# Patient Record
Sex: Male | Born: 1945 | Race: White | Hispanic: No | Marital: Married | Smoking: Never smoker
Health system: Southern US, Community
[De-identification: ages and names within clinical notes are randomized; demographics above are authoritative.]

## PROBLEM LIST (undated history)

## (undated) DIAGNOSIS — I10 Essential (primary) hypertension: Secondary | ICD-10-CM

## (undated) DIAGNOSIS — I2510 Atherosclerotic heart disease of native coronary artery without angina pectoris: Secondary | ICD-10-CM

## (undated) DIAGNOSIS — E785 Hyperlipidemia, unspecified: Secondary | ICD-10-CM

## (undated) DIAGNOSIS — Z951 Presence of aortocoronary bypass graft: Secondary | ICD-10-CM

## (undated) DIAGNOSIS — I214 Non-ST elevation (NSTEMI) myocardial infarction: Secondary | ICD-10-CM

## (undated) HISTORY — DX: Non-ST elevation (NSTEMI) myocardial infarction: I21.4

## (undated) HISTORY — DX: Atherosclerotic heart disease of native coronary artery without angina pectoris: I25.10

## (undated) HISTORY — DX: Essential (primary) hypertension: I10

## (undated) HISTORY — DX: Hyperlipidemia, unspecified: E78.5

## (undated) HISTORY — DX: Presence of aortocoronary bypass graft: Z95.1

---

## 2009-02-02 ENCOUNTER — Inpatient Hospital Stay (HOSPITAL_COMMUNITY): Admission: EM | Admit: 2009-02-02 | Discharge: 2009-02-07 | Payer: Self-pay | Admitting: Cardiology

## 2009-02-02 ENCOUNTER — Ambulatory Visit: Payer: Self-pay | Admitting: Surgery

## 2009-02-02 DIAGNOSIS — I214 Non-ST elevation (NSTEMI) myocardial infarction: Secondary | ICD-10-CM

## 2009-02-02 HISTORY — PX: CORONARY ARTERY BYPASS GRAFT: SHX141

## 2009-02-02 HISTORY — DX: Non-ST elevation (NSTEMI) myocardial infarction: I21.4

## 2009-02-02 HISTORY — PX: CARDIAC CATHETERIZATION: SHX172

## 2009-02-17 ENCOUNTER — Inpatient Hospital Stay (HOSPITAL_COMMUNITY): Admission: EM | Admit: 2009-02-17 | Discharge: 2009-02-20 | Payer: Self-pay | Admitting: Emergency Medicine

## 2009-02-17 HISTORY — PX: CARDIAC CATHETERIZATION: SHX172

## 2009-02-28 ENCOUNTER — Ambulatory Visit: Payer: Self-pay | Admitting: Surgery

## 2009-02-28 ENCOUNTER — Encounter: Admission: RE | Admit: 2009-02-28 | Discharge: 2009-02-28 | Payer: Self-pay | Admitting: Surgery

## 2009-11-02 IMAGING — CR DG CHEST 2V
2 series · 2 of 2 positions shown · non-contrast
Comparison: 02/03/2009

CLINICAL DATA: Status post CABG.

CHEST - 2 VIEW

[w chest pa]
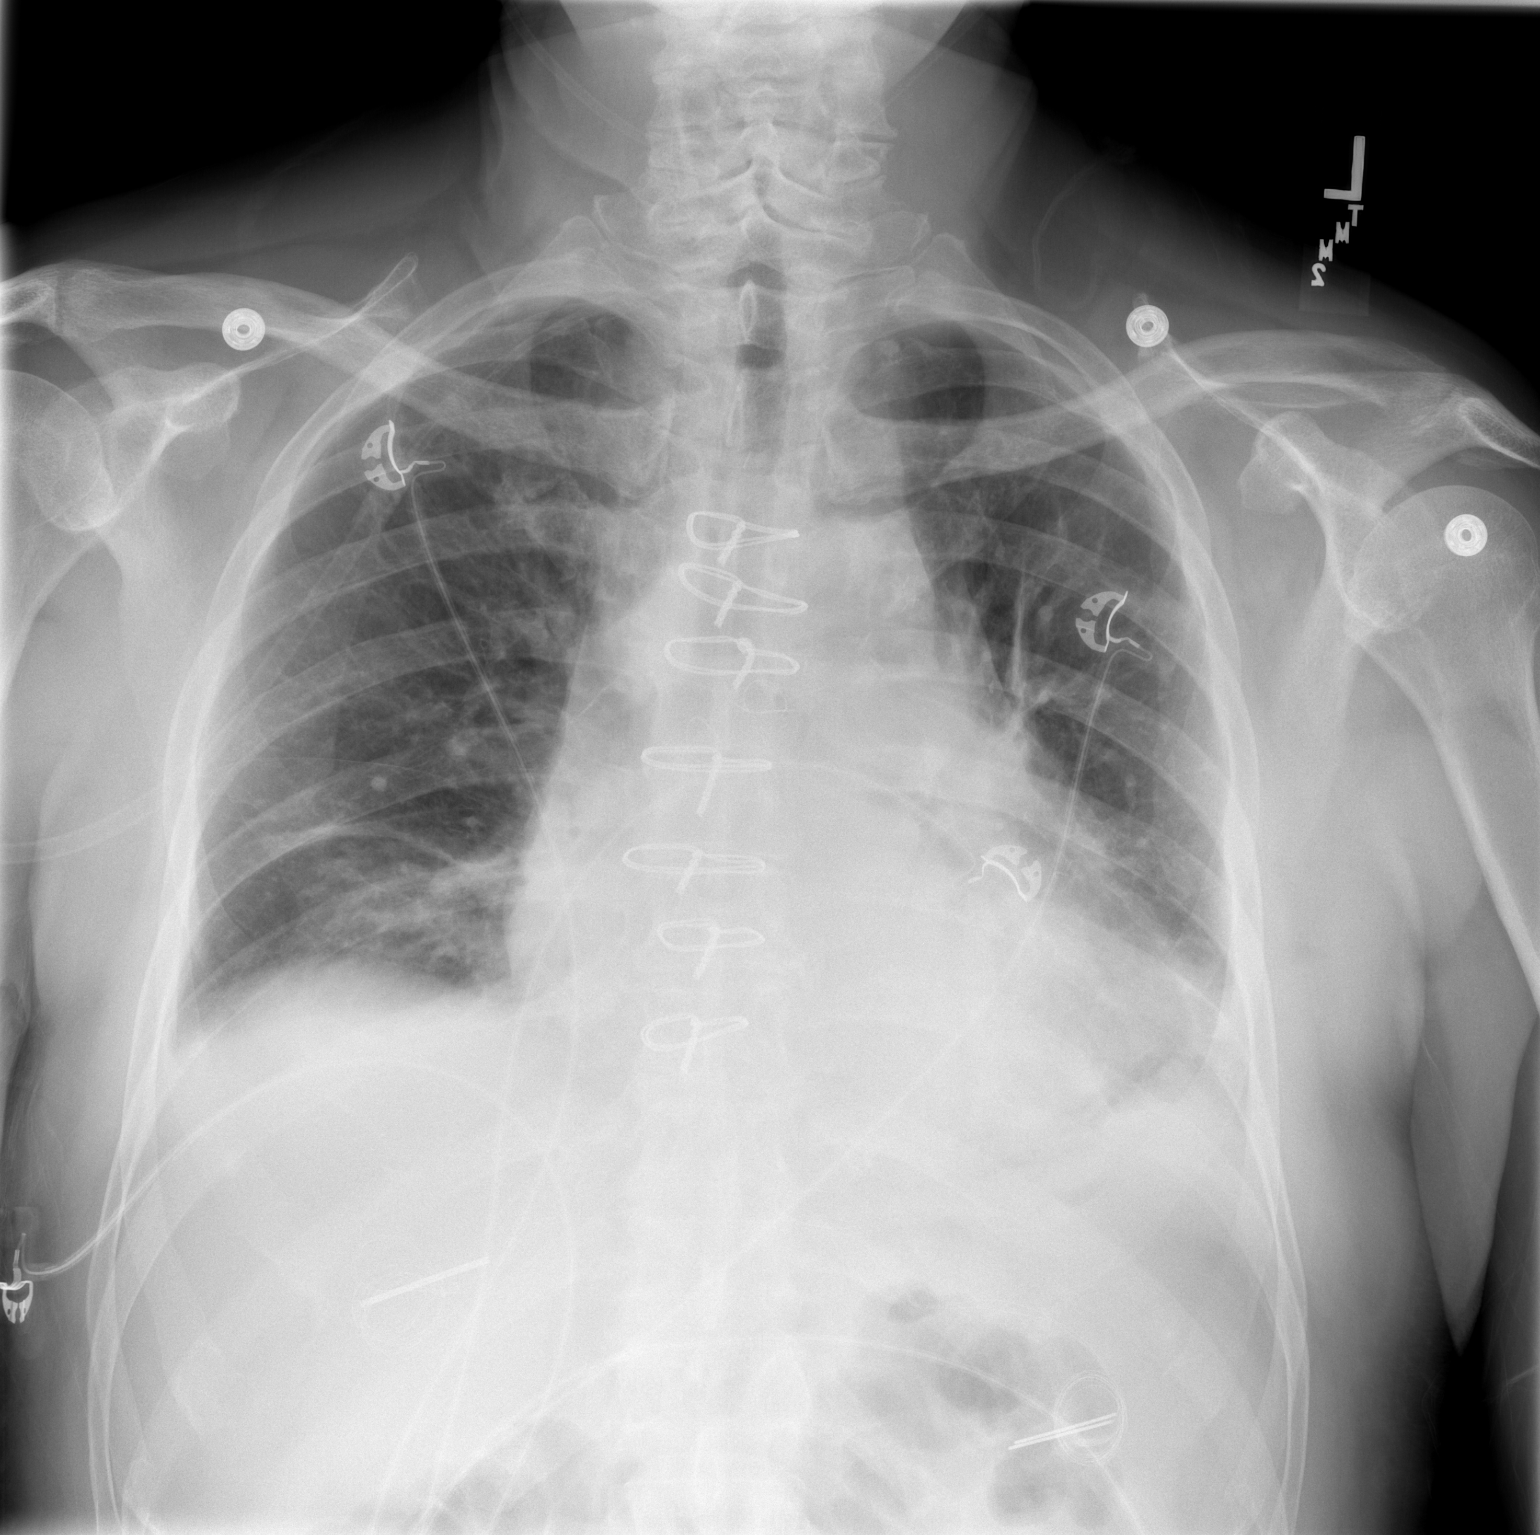

[w chest lat]
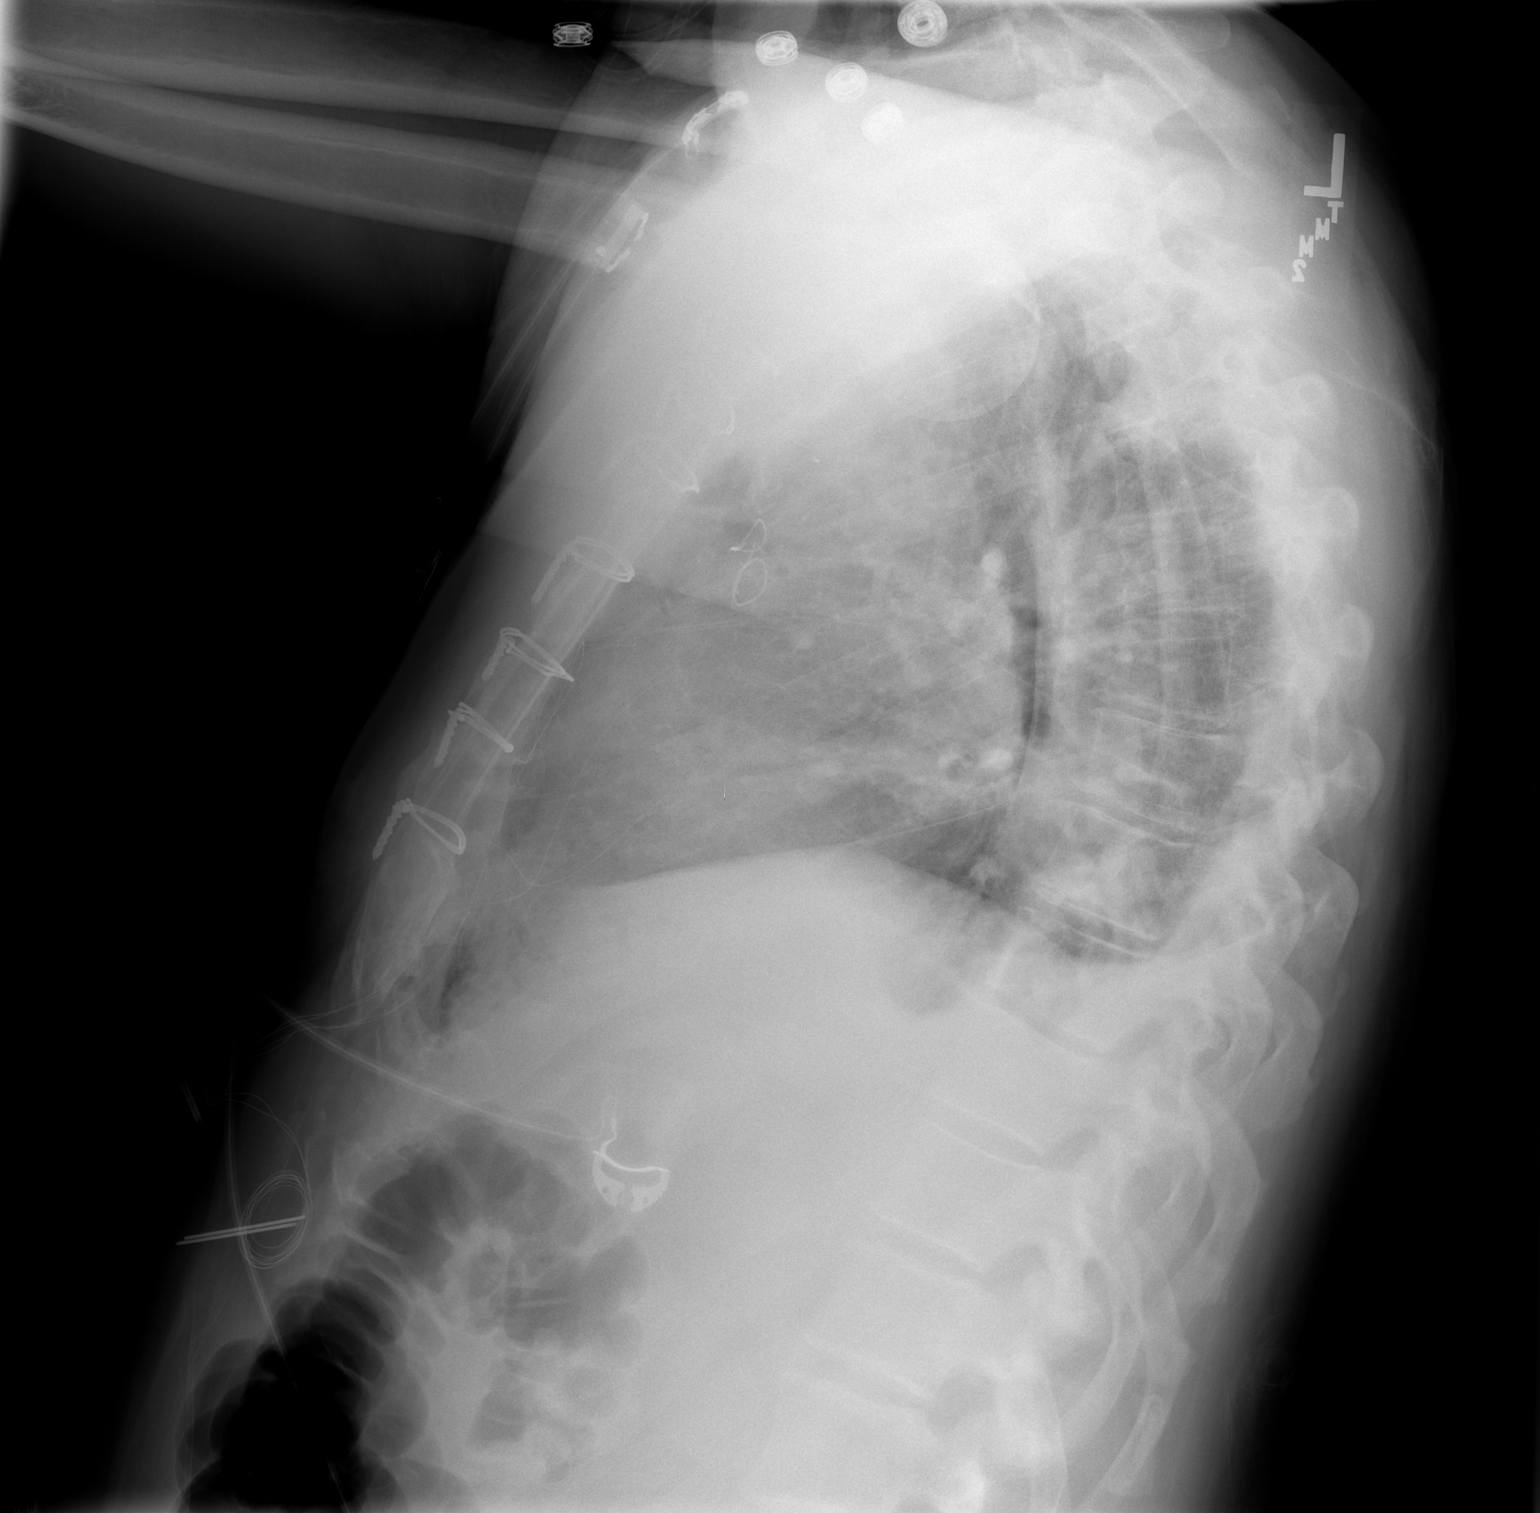

[2 of 2 positions shown; findings below may reference images not displayed]

FINDINGS: The heart size is enlarged.

The chest tubes and mediastinal drain have been removed.  The Swan-
Ganz catheter has also been removed.

There is a tiny right apical pneumothorax which measuresa
approximately 5%. There is atelectasis within both lung bases.

Small bilateral pleural effusions are suspected.
IMPRESSION: 1.  Bilateral pleural effusions and bibasilar atelectasis.
2.  Small right apical pneumothorax.

## 2010-09-21 LAB — BASIC METABOLIC PANEL
BUN: 11 mg/dL (ref 6–23)
CO2: 27 mEq/L (ref 19–32)
Calcium: 8.8 mg/dL (ref 8.4–10.5)
Chloride: 106 mEq/L (ref 96–112)
Chloride: 107 mEq/L (ref 96–112)
Chloride: 107 mEq/L (ref 96–112)
Creatinine, Ser: 1.55 mg/dL — ABNORMAL HIGH (ref 0.4–1.5)
GFR calc Af Amer: 55 mL/min — ABNORMAL LOW (ref >60)
GFR calc Af Amer: 59 mL/min — ABNORMAL LOW (ref >60)
GFR calc non Af Amer: 49 mL/min — ABNORMAL LOW (ref >60)
Glucose, Bld: 106 mg/dL — ABNORMAL HIGH (ref 70–99)
Potassium: 4 mEq/L (ref 3.5–5.1)
Potassium: 4.1 mEq/L (ref 3.5–5.1)
Sodium: 139 mEq/L (ref 135–145)
Sodium: 140 mEq/L (ref 135–145)

## 2010-09-21 LAB — HEMOGLOBIN A1C: Hgb A1c MFr Bld: 5.6 % (ref 4.6–6.1)

## 2010-09-21 LAB — DIFFERENTIAL
Eosinophils Relative: 4 % (ref 0–5)
Lymphocytes Relative: 13 % (ref 12–46)
Monocytes Relative: 6 % (ref 3–12)
Neutrophils Relative %: 77 % (ref 43–77)

## 2010-09-21 LAB — CBC
HCT: 29.2 % — ABNORMAL LOW (ref 39.0–52.0)
Hemoglobin: 9.8 g/dL — ABNORMAL LOW (ref 13.0–17.0)
MCHC: 33.7 g/dL (ref 30.0–36.0)
RBC: 3.31 MIL/uL — ABNORMAL LOW (ref 4.22–5.81)
RBC: 3.63 MIL/uL — ABNORMAL LOW (ref 4.22–5.81)
RBC: 3.72 MIL/uL — ABNORMAL LOW (ref 4.22–5.81)
RDW: 14.2 % (ref 11.5–15.5)
WBC: 5.1 10*3/uL (ref 4.0–10.5)
WBC: 8.1 10*3/uL (ref 4.0–10.5)

## 2010-09-21 LAB — PROTIME-INR
INR: 1.2 (ref 0.00–1.49)
Prothrombin Time: 14.6 seconds (ref 11.6–15.2)

## 2010-09-21 LAB — COMPREHENSIVE METABOLIC PANEL
Alkaline Phosphatase: 77 U/L (ref 39–117)
BUN: 15 mg/dL (ref 6–23)
CO2: 28 mEq/L (ref 19–32)
GFR calc non Af Amer: 45 mL/min — ABNORMAL LOW (ref >60)
Glucose, Bld: 116 mg/dL — ABNORMAL HIGH (ref 70–99)
Potassium: 4 mEq/L (ref 3.5–5.1)
Total Bilirubin: 0.6 mg/dL (ref 0.3–1.2)
Total Protein: 5.3 g/dL — ABNORMAL LOW (ref 6.0–8.3)

## 2010-09-21 LAB — CARDIAC PANEL(CRET KIN+CKTOT+MB+TROPI)
CK, MB: 0.8 ng/mL (ref 0.3–4.0)
CK, MB: 1.6 ng/mL (ref 0.3–4.0)
Relative Index: 11.7 — ABNORMAL HIGH (ref 0.0–2.5)
Relative Index: INVALID (ref 0.0–2.5)
Total CK: 109 U/L (ref 7–232)
Total CK: 39 U/L (ref 7–232)
Troponin I: 0.81 ng/mL (ref 0.00–0.06)
Troponin I: 2.53 ng/mL (ref 0.00–0.06)

## 2010-09-21 LAB — POCT CARDIAC MARKERS: Myoglobin, poc: 74.6 ng/mL (ref 12–200)

## 2010-09-21 LAB — TROPONIN I: Troponin I: 0.49 ng/mL — ABNORMAL HIGH (ref 0.00–0.06)

## 2010-09-21 LAB — CK TOTAL AND CKMB (NOT AT ARMC)
CK, MB: 5.1 ng/mL — ABNORMAL HIGH (ref 0.3–4.0)
Relative Index: INVALID (ref 0.0–2.5)

## 2010-09-21 LAB — TSH: TSH: 5.209 u[IU]/mL — ABNORMAL HIGH (ref 0.350–4.500)

## 2010-09-22 LAB — POCT I-STAT 4, (NA,K, GLUC, HGB,HCT)
Glucose, Bld: 101 mg/dL — ABNORMAL HIGH (ref 70–99)
Glucose, Bld: 107 mg/dL — ABNORMAL HIGH (ref 70–99)
Glucose, Bld: 111 mg/dL — ABNORMAL HIGH (ref 70–99)
HCT: 25 % — ABNORMAL LOW (ref 39.0–52.0)
HCT: 26 % — ABNORMAL LOW (ref 39.0–52.0)
HCT: 30 % — ABNORMAL LOW (ref 39.0–52.0)
Hemoglobin: 11.9 g/dL — ABNORMAL LOW (ref 13.0–17.0)
Hemoglobin: 8.5 g/dL — ABNORMAL LOW (ref 13.0–17.0)
Potassium: 3.5 mEq/L (ref 3.5–5.1)
Potassium: 3.7 mEq/L (ref 3.5–5.1)
Potassium: 4.7 mEq/L (ref 3.5–5.1)
Sodium: 133 mEq/L — ABNORMAL LOW (ref 135–145)
Sodium: 139 mEq/L (ref 135–145)

## 2010-09-22 LAB — POCT I-STAT 3, ART BLOOD GAS (G3+)
Acid-base deficit: 2 mmol/L (ref 0.0–2.0)
Bicarbonate: 23.8 mEq/L (ref 20.0–24.0)
O2 Saturation: 98 %
Patient temperature: 35.3
Patient temperature: 37.3
TCO2: 24 mmol/L (ref 0–100)
TCO2: 27 mmol/L (ref 0–100)
pCO2 arterial: 38.9 mmHg (ref 35.0–45.0)
pCO2 arterial: 43.2 mmHg (ref 35.0–45.0)
pH, Arterial: 7.34 — ABNORMAL LOW (ref 7.350–7.450)
pH, Arterial: 7.433 (ref 7.350–7.450)
pH, Arterial: 7.502 — ABNORMAL HIGH (ref 7.350–7.450)
pH, Arterial: 7.512 — ABNORMAL HIGH (ref 7.350–7.450)
pO2, Arterial: 109 mmHg — ABNORMAL HIGH (ref 80.0–100.0)
pO2, Arterial: 237 mmHg — ABNORMAL HIGH (ref 80.0–100.0)

## 2010-09-22 LAB — BASIC METABOLIC PANEL
CO2: 26 mEq/L (ref 19–32)
CO2: 28 mEq/L (ref 19–32)
Chloride: 102 mEq/L (ref 96–112)
Chloride: 107 mEq/L (ref 96–112)
Chloride: 109 mEq/L (ref 96–112)
Creatinine, Ser: 1.23 mg/dL (ref 0.4–1.5)
Creatinine, Ser: 1.24 mg/dL (ref 0.4–1.5)
GFR calc Af Amer: >60 mL/min (ref >60)
GFR calc Af Amer: >60 mL/min (ref >60)
Glucose, Bld: 143 mg/dL — ABNORMAL HIGH (ref 70–99)
Glucose, Bld: 155 mg/dL — ABNORMAL HIGH (ref 70–99)
Potassium: 4.2 mEq/L (ref 3.5–5.1)
Potassium: 4.7 mEq/L (ref 3.5–5.1)
Sodium: 139 mEq/L (ref 135–145)

## 2010-09-22 LAB — CBC
HCT: 25 % — ABNORMAL LOW (ref 39.0–52.0)
HCT: 27.8 % — ABNORMAL LOW (ref 39.0–52.0)
HCT: 28 % — ABNORMAL LOW (ref 39.0–52.0)
HCT: 37.8 % — ABNORMAL LOW (ref 39.0–52.0)
Hemoglobin: 13 g/dL (ref 13.0–17.0)
Hemoglobin: 8.8 g/dL — ABNORMAL LOW (ref 13.0–17.0)
Hemoglobin: 9.1 g/dL — ABNORMAL LOW (ref 13.0–17.0)
Hemoglobin: 9.5 g/dL — ABNORMAL LOW (ref 13.0–17.0)
Hemoglobin: 9.6 g/dL — ABNORMAL LOW (ref 13.0–17.0)
MCHC: 34.1 g/dL (ref 30.0–36.0)
MCHC: 34.3 g/dL (ref 30.0–36.0)
MCHC: 35 g/dL (ref 30.0–36.0)
MCHC: 35.1 g/dL (ref 30.0–36.0)
MCV: 87.6 fL (ref 78.0–100.0)
MCV: 88.3 fL (ref 78.0–100.0)
MCV: 88.6 fL (ref 78.0–100.0)
MCV: 88.8 fL (ref 78.0–100.0)
RBC: 2.93 MIL/uL — ABNORMAL LOW (ref 4.22–5.81)
RBC: 3.22 MIL/uL — ABNORMAL LOW (ref 4.22–5.81)
RBC: 4.32 MIL/uL (ref 4.22–5.81)
RDW: 13.1 % (ref 11.5–15.5)
RDW: 13.6 % (ref 11.5–15.5)
RDW: 13.6 % (ref 11.5–15.5)
RDW: 13.9 % (ref 11.5–15.5)
WBC: 6.3 10*3/uL (ref 4.0–10.5)
WBC: 7.4 10*3/uL (ref 4.0–10.5)

## 2010-09-22 LAB — LIPID PANEL
Triglycerides: 28 mg/dL (ref <150)
VLDL: 6 mg/dL (ref 0–40)

## 2010-09-22 LAB — COMPREHENSIVE METABOLIC PANEL
AST: 19 U/L (ref 0–37)
BUN: 11 mg/dL (ref 6–23)
CO2: 28 mEq/L (ref 19–32)
Chloride: 102 mEq/L (ref 96–112)
Creatinine, Ser: 1.21 mg/dL (ref 0.4–1.5)
GFR calc non Af Amer: >60 mL/min (ref >60)
Glucose, Bld: 112 mg/dL — ABNORMAL HIGH (ref 70–99)
Total Bilirubin: 1 mg/dL (ref 0.3–1.2)

## 2010-09-22 LAB — TYPE AND SCREEN: Antibody Screen: NEGATIVE

## 2010-09-22 LAB — POCT I-STAT 3, VENOUS BLOOD GAS (G3P V)
Bicarbonate: 24.2 mEq/L — ABNORMAL HIGH (ref 20.0–24.0)
TCO2: 25 mmol/L (ref 0–100)
pCO2, Ven: 39.5 mmHg — ABNORMAL LOW (ref 45.0–50.0)
pH, Ven: 7.395 — ABNORMAL HIGH (ref 7.250–7.300)
pO2, Ven: 47 mmHg — ABNORMAL HIGH (ref 30.0–45.0)

## 2010-09-22 LAB — MRSA PCR SCREENING: MRSA by PCR: NEGATIVE

## 2010-09-22 LAB — POCT I-STAT GLUCOSE
Glucose, Bld: 105 mg/dL — ABNORMAL HIGH (ref 70–99)
Operator id: 3342

## 2010-09-22 LAB — MAGNESIUM: Magnesium: 3 mg/dL — ABNORMAL HIGH (ref 1.5–2.5)

## 2010-09-22 LAB — APTT: aPTT: 36 seconds (ref 24–37)

## 2010-09-22 LAB — GLUCOSE, CAPILLARY: Glucose-Capillary: 100 mg/dL — ABNORMAL HIGH (ref 70–99)

## 2010-10-30 NOTE — Op Note (Signed)
[name], [name]                  ACCOUNT NO.:  [id number]   MEDICAL RECORD NO.:  [id number]          PATIENT TYPE:  INP   LOCATION:  2308                         FACILITY:  [facility]   PHYSICIAN:  Evelene Croon, M.D.     DATE OF BIRTH:  05-26-1946   DATE OF PROCEDURE:  02/03/2009  DATE OF DISCHARGE:                               OPERATIVE REPORT   PREOPERATIVE DIAGNOSES:  Severe three-vessel coronary disease with  unstable angina and non-ST-segment elevation myocardial infarction.   POSTOPERATIVE DIAGNOSIS:  Severe three-vessel coronary disease with  unstable angina and non-ST-segment elevation myocardial infarction.   OPERATIVE PROCEDURES:  Median sternotomy, extracorporeal circulation,  emergency coronary artery bypass graft surgery x3 using a left internal  mammary artery graft to the left anterior descending coronary artery,  with the saphenous vein graft to the diagonal branch of the LAD, and a  saphenous vein graft to the posterior descending branch of the right  coronary artery.  Endoscopic vein harvesting from the right leg.   ATTENDING SURGEON:  Evelene Croon, MD   ASSISTANT:  Rowe Clack, PA-C   ANESTHESIA:  General endotracheal.   CLINICAL HISTORY:  This patient is a previously healthy 65 year old  gentleman with a history of hypertension who recently developed  substernal chest pain on Tuesday of this week.  He said that he was seen  in a [facility] emergency room twice with chest pain, which  resolved and he was discharge from the emergency room.  On the night of  December 02, 2008, he developed stuttering chest pain all night with pain  recurring about every 2 hours.  The pain was relieved with a single  nitroglycerin until early morning when the pain did not resolve with  nitroglycerin and he presented again to the [facility] emergency  room.  Troponin was 0.44 at that time and he was transferred to [facility] for further care.  He was taken to cath lab  urgently by Dr. Julieanne Manson and cardiac catheterization showed severe three vessel disease.  There was a subtotal mid LAD stenosis with faint filling of the distal  LAD around the apex.  There is about 90% diagonal stenosis.  The left  circumflex was a dominant vessel with irregularities, but no stenosis in  the proximal and midportions.  Distally beyond the largest marginal  branch, there is about 60% stenosis and then a 90% stenosis, which  compromised a small posterolateral branch, as well as a large posterior  descending branch.  The right coronary artery was a small nondominant  vessel with moderate stenosis.  Left ventricular function appeared  fairly well preserved, although there was severe hypokinesis of the  distal anterior wall and apex, as well as the inferior wall.  After  being called to the cath lab to see the patient, I felt it would be best  to proceed with emergent coronary bypass surgery given his symptoms and  anatomy.  I discussed the operative procedure with the patient and his  family including alternatives, benefits, and risks including, but not  limited to  bleeding, blood transfusion, infection, stroke, myocardial  infarction, graft failure, and death.  They understood and agreed to  proceed.   OPERATIVE PROCEDURE:  The patient was taken to the operating room and  placed on table in supine position.  After induction of general  endotracheal anesthesia, a Foley catheter was placed in the bladder  using sterile technique.  Preoperative intravenous antibiotics were  given.  Transesophageal echocardiogram was performed by Anesthesiology.  Dr. Kipp Brood performed the TEE.  This showed severe hypokinesis to  akinesis of the distal anterior wall and apex, as well as the inferior  wall.  There was no significant mitral regurgitation.  There was no  aortic stenosis or insufficiency.   Then, the chest, abdomen, and both lower extremities were prepped and  draped in  usual sterile manner.  The chest was entered through a median  sternotomy incision.  Examination of the heart showed hypokinesis of the  anterior wall.  There was good right ventricular function.  The  ascending aorta was of normal size and had no palpable plaques in it.   Then, the left internal mammary artery was harvested from the chest wall  as a pedicle graft.  This is a medium caliber vessel with excellent  blood flow through it.  At the same time, a segment of greater saphenous  vein was harvested from the right leg using endoscopic vein harvest  technique.  This vein was of medium size and good quality.   Then, the patient was heparinized when an adequate activated clotting  time was achieved.  The distal ascending aorta was cannulated using a 20-  French aortic cannula for arterial inflow.  Venous outflow was achieved  using a two-stage venous cannula through the right atrial appendage.  An  antegrade cardioplegia and vent cannula was inserted in order to repair.   The patient was placed on cardiopulmonary bypass and distal coronaries  identified.  The LAD was heavily diseased in its proximal and  midportions, but the distal portion of the vessel was free of disease.  The diagonal branches were small vessels.  The left circumflex gave off  single large marginal branch and midportion, which had no disease in it.  Beyond this, there were too small posterolateral branches that were not  graftable and then a large posterior descending branch, which was  graftable.  The right coronary artery was a small nondominant vessel.   Then, the aorta was cross-clamped and 1500 cc of cold blood antegrade  cardioplegia was administered in the aortic root with quick arrest of  the heart.  Systemic hypothermia to 20 degrees centigrade and topical  hypothermia with iced saline was used.  The temperature probe was placed  in septum insulating pad in the pericardium.  Myocardial temperature  came  down to about 12 degrees centigrade.  Then, the first distal  anastomosis was performed to the posterior descending branch of the left  circumflex coronary artery.  The internal diameter was about 1.75 mm.  Conduit used was a segment of greater saphenous vein and the anastomosis  performed in end-to-side manner using continuous 7-0 Prolene suture.  Flow was noted through the graft was excellent.   Second distal anastomosis was performed to the diagonal branch.  There  is only one diagonal branch that was large enough to graft.  The  internal diameter of this vessel was about 1.6 mm.  Conduit used was a  segment of greater saphenous vein and the anastomosis performed in end-  to-side manner using continuous 7-0 Prolene suture.  Flow was noted  through the graft and was excellent.  Then, another dose of cardioplegia  was given down the vein grafts and aortic root.   The third distal anastomosis was performed at the distal LAD.  The  internal diameter here was about 1.6 mm.  The conduit used was the left  internal mammary graft and was brought through an opening in the left  pericardium anterior to the phrenic nerve.  We anastomosed the LAD in an  end-to-side manner using continuous 8-0 Prolene suture.  The pedicle was  sutured, the epicardium with 6-0 Prolene sutures.  The patient was then  rewarmed to 37 degrees centigrade.  With the cross-clamp in place, the  two proximal vein graft anastomoses were performed in an end-to-side  manner using continuous 6-0 Prolene suture.  The clamp was then removed  from mammary artery pedicle.  There is rapid warming of the ventricular  septum and return of spontaneous ventricular fibrillation.  The cross-  clamp removed with time of 59 minutes and the patient returned to sinus  rhythm.  The proximal and distal anastomoses appeared hemostatic while  the grafts satisfactory.  Graft markers placed around the proximal  anastomoses.  Two temporary right  ventricular and right atrial pacing  wires were placed above through the skin.   Again, the patient rewarmed to 37 degrees centigrade.  He was weaned  from cardiopulmonary bypass on no inotropic agents.  Total bypass time  was 59 minutes.  Cardiac function appeared excellent with the cardiac  output of 7 L per minute.  TEE was again performed and showed  improvement and contractility of the distal anterior wall apex and  inferior wall.  There was no mitral regurgitation.  Protamine was then  given and the venous and aortic cannula was removed without difficulty.  Hemostasis was achieved.  Three chest tubes were placed in the post  pericardium, one in the left pleural space and one in the anterior  mediastinum.  The pericardium was loosely reapproximated over the heart.  Sternum was closed with double #6 stainless steel wires.  The fascia was  closed with continuous #1 Vicryl suture.  Subcutaneous tissue was closed  with continuous 2-0 Vicryl and the skin with a 3-0 Vicryl subcuticular  closure.  The lower extremity vein harvest site was closed in a similar  manner.  The sponge, needle, instrument counts were correct according to  scrub nurse.  Dry sterile dressing applied over the incisions around the  chest tubes, which were Pleur-Evac suction.  The patient remained  hemodynamically stable and was transported to the SICU in stable  condition.      Evelene Croon, M.D.  Electronically Signed     BB/MEDQ  D:  02/03/2009  T:  02/04/2009  Job:  [id number]

## 2010-10-30 NOTE — Assessment & Plan Note (Signed)
OFFICE VISIT   [name], [name]  DOB:  03-27-1946                                        February 28, 2009  CHART #:  [id number]   The patient returned to my office today for followup status post  emergency coronary artery bypass graft surgery x3 on February 03, 2009.  His initial postoperative course was uncomplicated.  He said that after  discharge he was doing well but then had an episode of acute chest pain  that prompted readmission and repeat catheterization by Dr. Clarene Duke.  This showed occlusion of a saphenous vein graft to a small diagonal  branch.  The vein graft was filled with thrombus.  Attempt at opening  the vein graft was unsuccessful.  He had a patent vein graft to the  posterior descending branch of the left circumflex and a patent left  internal mammary graft to the LAD.  He said he was treated medically and  started on Plavix and was discharged.  He said that since discharge he  has continued to improve and is walking without chest pain or shortness  of breath.  He currently has no complaints.   PHYSICAL EXAMINATION:  Vital Signs:  Today his blood pressure is 125/80,  his pulse is 54 and regular, respiratory rate is 18 and unlabored.  Oxygen saturation on room air is 98%.  General:  He looks well.  Cardiac:  Regular rate and rhythm with normal heart sounds.  Lungs:  Clear.  Chest incision is healing well and sternum is stable.  Extremities:  His leg incision is healing well and there is no  peripheral edema.   A followup chest x-ray shows clear lung fields and no pleural effusions.   His medications are aspirin 325 mg daily, Plavix 75 mg daily, Lopressor  25 mg b.i.d., oxycodone p.r.n. for pain, Zocor 20 mg daily, flaxseed oil  daily, Protonix 40 mg daily, and sublingual nitroglycerin p.r.n.   IMPRESSION:  Overall, the patient is doing fairly well following his  bypass surgery and subsequent diagonal vein graft occlusion.  I suspect  this vein graft probably occluded due to large size of his vein compared  to the small size of his diagonal branch.  He is currently asymptomatic.  I told him he could return to driving a car when he feels comfortable  with that, but should refrain from lifting anything heavier than 10  pounds for total of 3 months from the date of surgery.  He will continue  to follow up with Dr. Clarene Duke and will contact me if he develops any  problem with his incision.   [name], M.D.  Electronically Signed   BB/MEDQ  D:  02/28/2009  T:  03/01/2009  Job:  [id number]   cc:   Thereasa Solo. Little, M.D.

## 2010-10-30 NOTE — Assessment & Plan Note (Signed)
OFFICE VISIT   DRAKEN, FARRIOR  DOB:  08/01/1945                                        February 28, 2009  CHART #:  [id number]   The patient returned to my office today for follow up status post  emergent coronary artery bypass graft surgery x3 on 02/03/2009.  He had  an initial uncomplicated postoperative course and was discharged home on  02/07/2009.  He said that he was readmitted to [facility] in early  September after developing acute left-sided chest pain.  He was taken to  cath lab by Dr. Clarene Duke.   Dictation ended at this point.   Evelene Croon, M.D.  Electronically Signed   BB/MEDQ  D:  02/28/2009  T:  03/01/2009  Job:  [id number]

## 2010-10-30 NOTE — Consult Note (Signed)
[name], [name]                  ACCOUNT NO.:  [id number]   MEDICAL RECORD NO.:  [id number]          PATIENT TYPE:  INP   LOCATION:  2308                         FACILITY:  [facility]   PHYSICIAN:  Evelene Croon, M.D.     DATE OF BIRTH:  07/15/1945   DATE OF CONSULTATION:  02/02/2009  DATE OF DISCHARGE:                                 CONSULTATION   REFERRING PHYSICIAN:  Thereasa Solo. Little, MD   REASON FOR CONSULTATION:  Severe three-vessel coronary artery disease  with unstable angina and non-ST segment elevation MI.   CLINICAL HISTORY:  I was asked by Dr. Clarene Duke to evaluate [name] in the  cardiac cath lab for consideration of urgent coronary bypass surgery.  He is a 65 year old previously healthy gentleman who began having  episodes of substernal chest discomfort on Tuesday of this week.  He  said that he was seen at [facility] Emergency Room twice with  chest pain and was discharged.  He began having episodes of chest pain  last night about every 2 hours, relieved with 1 sublingual  nitroglycerin.  This morning, he had an episode that was not relieved by  sublingual nitroglycerin and presented back to the [facility]  Emergency Room.  His troponin I was 0.44.  He was transferred to [facility] and underwent urgent catheterization by Dr. Clarene Duke, which showed a  subtotally occluded mid LAD with slow filling of the distal vessel  around the apex.  There was a moderate-sized diagonal branch that had  about 90% stenosis.  The left circumflex was a dominant vessel that had  proximal regularities.  There was about 80% mid to distal stenosis  beyond the large marginal branch.  Beyond this, there was about 90%  stenosis compromising his small posterolateral and a large posterior  descending branch.  The right coronary artery was a small nondominant  vessel with about 50% ostial and 50-60% midvessel stenosis.  Left  ventricular function was fairly well preserved with some  hypokinesis of  the distal anterior wall and apex.  The patient remained pain free in  the cath lab.   REVIEW OF SYSTEMS:  GENERAL:  He denies any fever or chills.  He has had  no recent weight changes.  He does report recent fatigue.  EYES:  Negative.  ENT:  Negative.  ENDOCRINE:  He denies diabetes and  hypothyroidism.  CARDIOVASCULAR:  As above.  He has had no exertional  dyspnea.  He denies PND and orthopnea.  He has had no peripheral edema  or palpitations.  RESPIRATORY:  He denies cough and sputum production.  GI:  He has had no nausea or vomiting.  He denies melena and bright red  blood per rectum.  GU:  He denies dysuria and hematuria.  MUSCULOSKELETAL:  He denies arthralgias and myalgias.  ALLERGIES:  None.  PSYCHIATRIC:  Negative.  HEMATOLOGICAL:  Negative.  NEUROLOGICAL:  He  denies any focal weakness or numbness.  He denies dizziness and syncope.  He has never had a TIA or stroke.   PAST MEDICAL HISTORY:  Significant only for an admission in 2005 for the  flu with associated dehydration.   SOCIAL HISTORY:  He is retired and married, lives with his wife.  He has  two children.  He is a nonsmoker and denies alcohol abuse.   FAMILY HISTORY:  Negative for cardiac disease.   PHYSICAL EXAMINATION:  VITAL SIGNS:  Blood pressure is 125/75, pulse is  75 and regular, respiratory rate is 16 and unlabored.  GENERAL:  He is a well-developed white male in no distress.  HEENT:  Normocephalic and atraumatic.  Pupils are equal and reactive to  light and accommodation.  Extraocular muscles are intact.  Throat is  clear.  NECK:  Normal carotid pulses bilaterally.  There are no bruits.  There  is no adenopathy or thyromegaly.  CARDIAC:  Regular rate and rhythm with normal S1 and S2.  There is no  murmur, rub, or gallop.  LUNGS:  Clear.  ABDOMEN:  Active bowel sounds.  His abdomen is soft, flat, and  nontender.  There are no palpable masses or organomegaly.  EXTREMITIES:  No peripheral  edema.  Pedal pulses are palpable  bilaterally.  SKIN:  Warm and dry.  NEUROLOGIC:  Alert and oriented x3.  Motor and sensory exams grossly  normal.   IMPRESSION:  [name] has severe three-vessel coronary artery disease  with subtotally occluded left anterior descending (coronary artery) and  high-grade diagonal and posterior descending stenosis.  He has had  stuttering chest pain all night last night and non-ST segment elevation  myocardial infarction.  I agree with Dr. Clarene Duke that proceeding ahead  with urgent coronary artery bypass graft surgery is a best treatment for  further ischemia and infarction.  I discussed the operative procedure  with the patient and his family including alternatives, benefits, and  risks including, but not limited to bleeding, blood transfusion,  infection, stroke, myocardial infarction, graft failure, and death.  He  understands all of this and agrees to proceed.      Evelene Croon, M.D.  Electronically Signed     BB/MEDQ  D:  02/02/2009  T:  02/03/2009  Job:  [id number]   cc:   Thereasa Solo. Little, M.D.

## 2010-10-30 NOTE — Cardiovascular Report (Signed)
[name], [name]                  ACCOUNT NO.:  [id number]   MEDICAL RECORD NO.:  [id number]          PATIENT TYPE:  INP   LOCATION:  2308                         FACILITY:  [facility]   PHYSICIAN:  Thereasa Solo. Little, M.D. DATE OF BIRTH:  05-May-1946   DATE OF PROCEDURE:  02/02/2009  DATE OF DISCHARGE:                            CARDIAC CATHETERIZATION   INDICATIONS FOR TEST:  This 65 year old male has been healthy with no  significant medical problems other than hypertension.  He has had drop  in his exercise tolerance over the last several weeks and has had 3  presentations to [facility] Emergency Room this week.  All for chest  pain.  He was given nitroglycerin, the chest pain improved promptly.  He  came back to the emergency room this morning after having chest pain  that was not responsive to nitroglycerin.  He was given a total of 2  sublingual in the emergency room, his pain went away, and his cardiac  markers came back elevated at 0.44.  His EKG showed no significant  change.  He has the left anterior fascicular block.  Because of the  elevated troponins, he was transferred here for evaluation and  treatment.   After obtaining informed consent, the patient was prepped and draped in  the usual sterile fashion exposing the right groin.  Following local  anesthetic with 1% Xylocaine, the Seldinger technique was employed, and  a 6-French introducer sheath was placed in the right femoral artery.  Left and right coronary arteriography, visualization of the internal  mammary artery, and ventriculography in the RAO projection was  performed.   RESULTS:  Total contrast 90 mL.   EQUIPMENT:  6-French Judkins configuration catheters and a no-torque  catheter was used to cannulate the right coronary artery.   1. Hemodynamic monitoring:  Central aortic pressure was 111/62.  Left      ventricular pressure was 112/1 with no gradient noted at that time      of pullback.  2.  Ventriculography:  Ventriculography in the RAO projection using 20      mL of contrast at 12 mL per second performed at the end of the      procedure revealed the apical and distal inferior wall to be      severely hypokinetic.  Ejection fraction was 45%.  No mitral      regurgitation was seen.  3. Coronary angiogram.      a.     Left main normal and bifurcated.      b.     LAD.  The LAD was subtotaled in its midportion and there       were multiple sequential areas of 90% narrowing distal to the       subtotaled area.  The first diagonal also had 80-90% narrowing in       its proximal segment.      c.     Circumflex.  The circumflex was a large dominant vessel of       about 3.5 to 4 mm in diameter.  There were 4  large OM vessels and       a PDA.  The proximal and mid segments has had mild irregularities       in the circumflex, but in the distal portion of the mid, there was       80% area of narrowing and 90% area distally at the takeoff of       fourth OM, and there was 60% narrowing at the takeoff of the PDA.   Right coronary artery:  The right coronary was nondominant.  He had  distal 70% narrowing and mid 60% narrowing.  There was some distal  collaterals to the PDA.   The internal mammary artery was 2.5 to 3 mm in diameter and widely  patent.   CONCLUSION:  1. Severe coronary artery disease in the left dominant system.  2. Mild left ventricular dysfunction, 45% ejection fraction with      apical wall motion abnormality.   He needs an urgent bypass surgery.  He has had chest pain intermittently  the entire morning.  He is, however, pain-free at this point.  I started  him on IV nitroglycerin and will start him on IV heparin once his  arterial sheath is out.  [facility] has been called and they are aware of the  patient.  I have ordered pre-CABG Dopplers.  He is on an ACE and an  aspirin.  He is on a beta-blocker and aspirin.  I did not start him on  an ACE inhibitor.  His  creatinine is 1.7.  He just had a contrast load.  I will restart ACE inhibitors in about 24-36 hours.           ______________________________  Thereasa Solo Little, M.D.     ABL/MEDQ  D:  02/02/2009  T:  02/03/2009  Job:  [id number]   cc:   [facility] Emergency Room  Cath Lab

## 2010-10-30 NOTE — Op Note (Signed)
[name], [name]                  ACCOUNT NO.:  [id number]   MEDICAL RECORD NO.:  [id number]          PATIENT TYPE:  INP   LOCATION:  2308                         FACILITY:  [facility]   PHYSICIAN:  Guadalupe Maple, M.D.  DATE OF BIRTH:  04-29-1946   DATE OF PROCEDURE:  02/02/2009  DATE OF DISCHARGE:                               OPERATIVE REPORT   PROCEDURE:  Intraoperative transesophageal echocardiography.   Mr. [name] is a 65 year old white male who presented with history of  unstable angina, was found to have critical coronary stenosis on cardiac  catheterization, and was brought directly to the operating room from the  cardiac catheterization lab.  Intraoperative transesophageal  echocardiography was requested to evaluate the left and right  ventricular function and to determine if any valvular pathology was  present and to serve as a monitor for intraoperative volume status.   The patient was brought to the operating room at [facility].  General anesthesia was induced without difficulty.  Following uneventful  tracheal intubation and orogastric suctioning, the transesophageal  echocardiography probe was then inserted into the esophagus without  difficulty.   IMPRESSION:  Prebypass Findings:  1. Aortic valve.  The aortic valve was trileaflet and opened normally      with no evidence of stenosis.  There was no aortic insufficiency.      There was no calcification of the leaflets.  2. Mitral valve.  The mitral leaflets coapted well without prolapse or      fluttering.  There was trace mitral insufficiency.  3. Left ventricle.  There was moderate left ventricular dysfunction.      There was mild hypokinesis of the basilar to mid inferior wall.      The distal anterior wall and anterior septum and the apex appeared      severely hypokinetic to akinetic.  Ejection fraction was estimated      at 35% to 40%.  There was no thrombus noted in the left ventricular      apex.   There were no thinned segments of the left ventricular      myocardium noted.  Left ventricular wall thickness measured 0.9 to      1.0 cm, end diastole at the mid papillary level concentrically.  4. Right ventricle.  The right ventricular function appeared within      normal limits.  There was good contractility of the right      ventricular free wall and normal downward motion of the lateral      tricuspid annulus.  5. Tricuspid valve.  Tricuspid valve appeared structurally normal with      trace tricuspid insufficiency.  6. Interatrial septum.  The interatrial septum was intact without      evidence of patent foramen ovale or atrial septal defects.  7. Left atrial appendage.  There was no thrombus noted in the left      atrial appendage or left atrial cavity.  8. Ascending aorta.  The ascending aorta appeared normal with a well-      defined sinotubular ridge and no significant  atheromatous disease      noted.   Postbypass Findings:  1. Aortic valve.  The aortic valve was unchanged from the prebypass      study and appeared normal.  2. Mitral valve.  The mitral valve again showed trace mitral      insufficiency and no prolapse or fluttering of the leaflets.  3. Left ventricle.  There was again hypokinesis of the distal anterior      wall and the anterior septum, which appeared somewhat improved from      the prebypass study.  The basilar and mid inferior wall appeared to      be      contracting normally.  Ejection fraction was estimated at 40% to      45%.  4. Right ventricle.  The right ventricular function appeared normal      and unchanged from the prebypass study.  5. Tricuspid valve.  The tricuspid valve again showed tricuspid      insufficiency.           ______________________________  Guadalupe Maple, M.D.     DCJ/MEDQ  D:  02/02/2009  T:  02/03/2009  Job:  [id number]

## 2010-10-30 NOTE — Discharge Summary (Signed)
[name], [name]                  ACCOUNT NO.:  [id number]   MEDICAL RECORD NO.:  [id number]          PATIENT TYPE:  INP   LOCATION:  2018                         FACILITY:  [facility]   PHYSICIAN:  Evelene Croon, M.D.     DATE OF BIRTH:  [birth year]   DATE OF ADMISSION:  02/02/2009  DATE OF DISCHARGE:  02/07/2009                               DISCHARGE SUMMARY   HISTORY:  The patient is a 65 year old white male with a history of  intermittent chest pain since the Monday prior to admission.  The  patient presented to the emergency department on 3 different occasions  with symptoms of chest pain associated with weakness and fatigue.  On  the third presentation, he was found to have elevation of his troponin I  to 0.44.  He was transferred to [facility] by [facility] for further  evaluation and treatment.  He was admitted for prompt cardiac  catheterization.  Additionally, he was treated medically with aspirin,  nitroglycerin as well as started on a heparin drip.   PAST MEDICAL HISTORY:  Insignificant.  He did have a previous  hospitalization in 2005 for flu with symptoms related to this to include  dehydration and syncope.  No history of diabetes.  No history of  hypertension.   ALLERGIES:  No known allergies.   OUTPATIENT MEDICATIONS:  Nitroglycerin p.r.n.   FAMILY HISTORY:  Mother had a history of heart disease at an older age.  Otherwise, no significant coronary artery disease in his family.   SOCIAL HISTORY:  He is married, retired, uses no tobacco and no alcohol.   REVIEW OF SYMPTOMS AND PHYSICAL EXAMINATION:  Please see the admission  history and physical.   HOSPITAL COURSE:  The patient was admitted urgently and transferred and  taken to the cardiac catheterization lab by Dr. Clarene Duke.  Findings were  substantial for severe 3-vessel coronary artery disease including  subtotal LAD.  Additionally, there was multiple stenoses of the  circumflex of 80%, 90%, and 60% stenosis.  Right  coronary artery had an  ostial 70% and mid 60% stenosis.  The left ventriculogram revealed  apical segment with distal inferior wall severe hypokinesis.  Ejection  fraction was measured at 45%.  Due to these findings, prompt cardiac  surgical consultation was obtained with Evelene Croon, MD, who evaluated  the patient and his studies, and agreed with recommendations to proceed  with emergent surgical revascularization.   PROCEDURE:  On February 02, 2009, he was taken to the cardiac operating  room where he underwent the following procedure, emergency coronary  artery bypass grafting x3 the following grafts were placed:  1. Left internal mammary artery to the LAD.  2. A saphenous vein graft to diagonal.  3. A saphenous vein graft to left posterior descending.   The patient tolerated the procedure well and was taken to the Surgical  Intensive Care Unit in stable condition.   POSTOPERATIVE HOSPITAL COURSE:  The patient has progressed quite nicely.  Inotropic support was weaned without significant difficulty.  He was  weaned from the ventilator also without difficulty.  He does have an  acute blood loss anemia.  His values have stabilized.  His most recent  hemoglobin and hematocrit dated February 06, 2009, are 9.9 and 28.6  respectively.  The patient initially did have a preoperative elevation  of his creatinine to 1.7, but postoperatively this has improved, and his  most recent renal function shows a BUN to be 19 and creatinine 1.24 on  February 06, 2009.  All routine lines, monitors, and drainage devices have  been discontinued in the standard fashion.  Incisions are healing well  without evidence of infection.  Oxygen has been weaned and he maintained  good saturations on room air.  The patient did have episode of  postoperative paroxysmal atrial fibrillation.  He was started initially  on amiodarone intravenous drip, but has transitioned to oral amiodarone.  He is currently maintaining  normal sinus rhythm.  The patient does have  a moderate postoperative volume overload, but this has improved over  time with diuresis.  Tentatively, he is felt to be stable for discharge  in the morning of February 07, 2009, pending morning round reevaluation.   Medications on discharge at the time of this dictation include the  following:  1. Amiodarone 400 mg twice daily for the next 7 days then he is      transition to 200 mg twice daily.  2. Aspirin 325 mg daily.  3. Lasix 40 mg daily for 5 additional days.  4. Metoprolol 25 mg twice daily.  5. Oxycodone 5 mg 1-2 every 4-6 hours as needed for pain.  6. Potassium chloride 20 mEq daily for 5 days.  7. Ramipril 1.25 mg daily.  8. Simvastatin 20 mg daily.  9. Flaxseed oil 3 capsules daily by mouth as taken preoperatively.  10.Omeprazole 20 mg 1 capsule by mouth taken daily as preoperatively.   INSTRUCTIONS:  The patient will receive written instructions in regard  to medications, activity, diet, wound care, and followup.   Follow up include appointment to see Dr. Laneta Simmers on September 14, at 11  a.m. with a chest x-ray.  Additionally, he is instructed to follow up  with Dr. Clarene Duke in 2 weeks post discharge.   FINAL DIAGNOSIS:  Acute non-ST-segment elevation myocardial infarction,  now status post surgical revascularization as described.   OTHER DIAGNOSES:  1. Postoperative acute blood loss anemia.  2. Postoperative volume overload.  3. Acute renal insufficiency preoperatively, resolved postoperatively      may have been related to some degree of dehydration.  4. Postoperative paroxysmal atrial fibrillation, currently in normal      sinus rhythm.      Rowe Clack, P.A.-C.      Evelene Croon, M.D.  Electronically Signed    WEG/MEDQ  D:  02/06/2009  T:  02/07/2009  Job:  [id number]   cc:   Thereasa Solo. Little, M.D.

## 2012-05-17 HISTORY — PX: TRANSTHORACIC ECHOCARDIOGRAM: SHX275

## 2012-06-01 ENCOUNTER — Other Ambulatory Visit (HOSPITAL_COMMUNITY): Payer: Self-pay | Admitting: Internal Medicine

## 2012-06-01 DIAGNOSIS — Z951 Presence of aortocoronary bypass graft: Secondary | ICD-10-CM

## 2012-06-04 ENCOUNTER — Ambulatory Visit (HOSPITAL_COMMUNITY)
Admission: RE | Admit: 2012-06-04 | Discharge: 2012-06-04 | Disposition: A | Discharge status: Home / Self Care | Payer: Medicare Other | Source: Ambulatory Visit | Attending: Internal Medicine | Admitting: Internal Medicine

## 2012-06-04 DIAGNOSIS — I10 Essential (primary) hypertension: Secondary | ICD-10-CM | POA: Insufficient documentation

## 2012-06-04 DIAGNOSIS — Z951 Presence of aortocoronary bypass graft: Secondary | ICD-10-CM | POA: Insufficient documentation

## 2012-06-04 DIAGNOSIS — I2510 Atherosclerotic heart disease of native coronary artery without angina pectoris: Secondary | ICD-10-CM | POA: Insufficient documentation

## 2012-06-04 DIAGNOSIS — E785 Hyperlipidemia, unspecified: Secondary | ICD-10-CM | POA: Insufficient documentation

## 2012-06-04 NOTE — Progress Notes (Signed)
[facility]   2D echo completed 06/04/2012.   Cindy Aysia Lowder, RDCS   

## 2012-11-10 ENCOUNTER — Telehealth: Payer: Self-pay | Admitting: Internal Medicine

## 2012-11-10 NOTE — Telephone Encounter (Signed)
Need an authorization from Dr Rennis Golden to stop his Plavix for seven days-so he can get an epidural Shot-Please fax to Dr Spillane-Fax#[phone]!

## 2012-11-10 NOTE — Telephone Encounter (Signed)
Returned call and spoke w/ pt's wife, Jousha Schwandt.  Informed form was faxed on 5.9.14 and will be faxed again.  Verbalized understanding.  Form faxed.

## 2012-11-11 ENCOUNTER — Telehealth: Payer: Self-pay | Admitting: *Deleted

## 2012-11-11 NOTE — Telephone Encounter (Signed)
Faxed clearance for lumbar epidural steroid injection . Per Dr. Rennis Golden  Hold ASA 7 days restart after;hold plavix 5 days then restart after.  Low cardiovascular risk

## 2013-05-17 ENCOUNTER — Encounter: Payer: Self-pay | Admitting: *Deleted

## 2013-05-18 ENCOUNTER — Ambulatory Visit (INDEPENDENT_AMBULATORY_CARE_PROVIDER_SITE_OTHER): Payer: Medicare Other | Admitting: Internal Medicine

## 2013-05-18 ENCOUNTER — Encounter: Payer: Self-pay | Admitting: Internal Medicine

## 2013-05-18 VITALS — BP 110/70 | HR 62 | Ht 70.0 in | Wt 196.4 lb

## 2013-05-18 DIAGNOSIS — I2510 Atherosclerotic heart disease of native coronary artery without angina pectoris: Secondary | ICD-10-CM

## 2013-05-18 DIAGNOSIS — I10 Essential (primary) hypertension: Secondary | ICD-10-CM

## 2013-05-18 DIAGNOSIS — E782 Mixed hyperlipidemia: Secondary | ICD-10-CM | POA: Insufficient documentation

## 2013-05-18 DIAGNOSIS — E785 Hyperlipidemia, unspecified: Secondary | ICD-10-CM

## 2013-05-18 DIAGNOSIS — Z951 Presence of aortocoronary bypass graft: Secondary | ICD-10-CM

## 2013-05-18 NOTE — Progress Notes (Signed)
OFFICE NOTE  Chief Complaint:  No complaints  Primary Care Physician: Todd Miyamoto, MD  HPI:  [name] is a 67 year old gentleman with a history of non-ST-elevation MI in August of 2010. He had multi-vessel coronary disease and bypass by Dr. Laneta Walters. Subsequent to that 1 month later he had chest pain and was found to have a clot in the saphenous vein graft to a diagonal which ultimately infarcted and was not amenable to opening. At the time of his initial presentation EF was 35-40%, subsequently was 45%. Since then he has had no significant heart failure symptoms, no chest pain, weight gain, worsening shortness of breath, palpitations, presyncope, syncopal symptoms. He recently had a problem with a herniated disc in his back which he managed conservatively. Surgery was contraindicated. He denies any chest pain or worsening shortness of breath. We repeated his echocardiogram in December 2013 which showed EF improved to 50-55%. There was moderate left atrial enlargement.    PMHx:  Past Medical History  Diagnosis Date  . NSTEMI (non-ST elevated myocardial infarction) 02/02/2009    CABGx3  . Hyperlipidemia   . Hypertension   . CAD (coronary artery disease)   . S/P CABG (coronary artery bypass graft)     Past Surgical History  Procedure Laterality Date  . Transthoracic echocardiogram  05/2012    EF 50-55%, grade 1 diastolic dysfunction; MV with systolic "bowing"; LA mod dilated  . Cardiac catheterization  02/02/2009    LAD subtotaled in midportion, 90% narrowing distal to subtotaled areas; first diagonal with 80-90% narrowing in prox segment; Cfx with prox & mid segments with ireegularities, distal portion 80% narrowing & 90& area distally at takeoff of 4th OM, 60% narrowing at takeoff of PDA (Dr. Langston Walters)  . Coronary artery bypass graft  02/02/2009    x3; LIMA to LAD, SVG to PDA, SVG to 1st diagonal (Dr. Wayland Walters)   . Cardiac catheterization  02/17/2009    occluded SVG to  small diagonal (Dr. Langston Walters)     FAMHx:  Family History  Problem Relation Age of Onset  . Heart disease Mother     pacemaker    SOCHx:   reports that he has never smoked. He has never used smokeless tobacco. He reports that he drinks about 6.0 ounces of alcohol per week. He reports that he does not use illicit drugs.  ALLERGIES:  No Known Allergies  ROS: A comprehensive review of systems was negative except for: Constitutional: positive for mild weight gain  HOME MEDS: Current Outpatient Prescriptions  Medication Sig Dispense Refill  . aspirin 81 MG tablet Take 81 mg by mouth daily.      . clopidogrel (PLAVIX) 75 MG tablet Take 75 mg by mouth daily with breakfast.      . gabapentin (NEURONTIN) 300 MG capsule Take 1 capsule by mouth 2 (two) times daily.      Marland Kitchen lisinopril (PRINIVIL,ZESTRIL) 20 MG tablet Take 20 mg by mouth daily.      . metoprolol (LOPRESSOR) 50 MG tablet Take 25 mg by mouth daily.      . simvastatin (ZOCOR) 20 MG tablet Take 20 mg by mouth daily.       No current facility-administered medications for this visit.    LABS/IMAGING: No results found for this or any previous visit (from the past 48 hour(s)). No results found.  VITALS: BP 110/70  Pulse 62  Ht 5\' 10"  (1.778 m)  Wt 196 lb 6.4 oz (89.086 kg)  BMI 28.18 kg/m2  EXAM: General appearance: alert and no distress Neck: no carotid bruit and no JVD Lungs: clear to auscultation bilaterally Heart: regular rate and rhythm, S1, S2 normal, no murmur, click, rub or gallop Abdomen: soft, non-tender; bowel sounds normal; no masses,  no organomegaly Extremities: extremities normal, atraumatic, no cyanosis or edema Pulses: 2+ and symmetric Skin: Skin color, texture, turgor normal. No rashes or lesions Neurologic: Grossly normal Psych: Mood, affect normal, pleasant  EKG: Normal sinus rhythm at 62, incomplete right bundle pattern  ASSESSMENT: 1. Coronary artery disease status post stenting in  2010 2. Three-vessel CABG with LIMA to LAD, SVG to PDA, and SVG to diagonal (occluded) 3. Hypertension-well controlled 4. Dyslipidemia  PLAN: 1.   [name] is doing well on his current medications. His blood pressure is well controlled. His heart rate is higher than it had been when we decreased the dose of his beta blocker at his last visit. His EF has improved significantly to 50-55%. He denies any further chest pain. He continues to be active but has had a small amount of weight gain over the holidays and will need to continue to work on this. He is due for a checkup again at the [facility] in [place] in a few weeks and will send results of his cholesterol profile to our office. The only other change it would make today is that he continues on full dose aspirin and Plavix. I think it is reasonable to switch him to low-dose aspirin and continue Plavix, with similar benefits and less bleeding risk. Plan to see him back in one year or sooner as necessary.  Todd Nose, MD, [facility] Attending Cardiologist [facility]  [name] 05/18/2013, 8:21 AM

## 2013-05-18 NOTE — Patient Instructions (Signed)
Your physician wants you to follow-up in: 1 year. You will receive a reminder letter in the mail two months in advance. If you don't receive a letter, please call our office to schedule the follow-up appointment.  Decrease aspirin to 81mg  daily.

## 2013-05-26 ENCOUNTER — Other Ambulatory Visit: Payer: Self-pay | Admitting: *Deleted

## 2013-05-26 MED ORDER — CLOPIDOGREL BISULFATE 75 MG PO TABS: 75.0000 mg | ORAL_TABLET | Freq: Every day | ORAL | Status: DC

## 2013-05-26 NOTE — Telephone Encounter (Signed)
Clopidogrel refilled electronically to [facility]

## 2013-05-31 ENCOUNTER — Other Ambulatory Visit: Payer: Self-pay | Admitting: *Deleted

## 2013-05-31 MED ORDER — LISINOPRIL 20 MG PO TABS: 20.0000 mg | ORAL_TABLET | Freq: Every day | ORAL | Status: DC

## 2013-05-31 NOTE — Telephone Encounter (Signed)
Rx was sent to pharmacy electronically. 

## 2013-07-12 ENCOUNTER — Other Ambulatory Visit: Payer: Self-pay

## 2013-07-12 MED ORDER — SIMVASTATIN 20 MG PO TABS: 20.0000 mg | ORAL_TABLET | Freq: Every day | ORAL | Status: DC

## 2013-07-12 MED ORDER — METOPROLOL TARTRATE 50 MG PO TABS: 25.0000 mg | ORAL_TABLET | Freq: Every day | ORAL | Status: DC

## 2013-07-12 NOTE — Telephone Encounter (Signed)
Rx was sent to pharmacy electronically. 

## 2013-07-21 ENCOUNTER — Other Ambulatory Visit: Payer: Self-pay | Admitting: *Deleted

## 2013-07-21 NOTE — Telephone Encounter (Signed)
Called patient to inform that Dr. Rennis GoldenHilty reviewed labs and were good.   Patient states needs prescriptions sent to [facility], but will call office back with medications, doses, frequency.

## 2013-07-23 ENCOUNTER — Telehealth: Payer: Self-pay | Admitting: *Deleted

## 2013-07-23 NOTE — Telephone Encounter (Signed)
Pt was calling in regards to Rx questions. Metoprolol  [facility]

## 2013-07-23 NOTE — Telephone Encounter (Signed)
Returned call.  Left message to call back Monday before 4pm.

## 2013-08-05 ENCOUNTER — Telehealth: Payer: Self-pay | Admitting: Internal Medicine

## 2013-08-05 MED ORDER — METOPROLOL TARTRATE 25 MG PO TABS: 25.0000 mg | ORAL_TABLET | Freq: Every day | ORAL | Status: DC

## 2013-08-05 MED ORDER — SIMVASTATIN 20 MG PO TABS: 20.0000 mg | ORAL_TABLET | Freq: Every day | ORAL | Status: DC

## 2013-08-05 MED ORDER — CLOPIDOGREL BISULFATE 75 MG PO TABS: 75.0000 mg | ORAL_TABLET | Freq: Every day | ORAL | Status: DC

## 2013-08-05 MED ORDER — LISINOPRIL 20 MG PO TABS: 20.0000 mg | ORAL_TABLET | Freq: Every day | ORAL | Status: DC

## 2013-08-05 NOTE — Telephone Encounter (Signed)
His new pharmacy [facility] said they have sent for his medicine  Over a week and still have not heard back from the request.

## 2013-08-05 NOTE — Telephone Encounter (Signed)
Returned call and pt verified x 2.  Pt informed message received and no information received from [facility].  Informed RN will review med list now and send in new Rxs.  Pt reviewed med list and Rxs sent to [facility].  Pt verbalized understanding and agreed w/ plan.  Pt stated he has 20-days worth of medication on hand.

## 2014-05-26 ENCOUNTER — Ambulatory Visit (INDEPENDENT_AMBULATORY_CARE_PROVIDER_SITE_OTHER): Payer: Medicare HMO | Admitting: Internal Medicine

## 2014-05-26 ENCOUNTER — Encounter: Payer: Self-pay | Admitting: Internal Medicine

## 2014-05-26 VITALS — BP 148/82 | HR 47 | Ht 70.0 in | Wt 189.9 lb

## 2014-05-26 DIAGNOSIS — R001 Bradycardia, unspecified: Secondary | ICD-10-CM

## 2014-05-26 DIAGNOSIS — Z951 Presence of aortocoronary bypass graft: Secondary | ICD-10-CM

## 2014-05-26 DIAGNOSIS — I2510 Atherosclerotic heart disease of native coronary artery without angina pectoris: Secondary | ICD-10-CM

## 2014-05-26 DIAGNOSIS — I2583 Coronary atherosclerosis due to lipid rich plaque: Secondary | ICD-10-CM

## 2014-05-26 DIAGNOSIS — E785 Hyperlipidemia, unspecified: Secondary | ICD-10-CM

## 2014-05-26 DIAGNOSIS — I10 Essential (primary) hypertension: Secondary | ICD-10-CM

## 2014-05-26 MED ORDER — LISINOPRIL 20 MG PO TABS: 20.0000 mg | ORAL_TABLET | Freq: Every day | ORAL | Status: DC

## 2014-05-26 MED ORDER — CLOPIDOGREL BISULFATE 75 MG PO TABS: 75.0000 mg | ORAL_TABLET | Freq: Every day | ORAL | Status: DC

## 2014-05-26 MED ORDER — METOPROLOL TARTRATE 25 MG PO TABS: 25.0000 mg | ORAL_TABLET | Freq: Every day | ORAL | Status: DC

## 2014-05-26 MED ORDER — SIMVASTATIN 20 MG PO TABS: 20.0000 mg | ORAL_TABLET | Freq: Every day | ORAL | Status: DC

## 2014-05-26 NOTE — Progress Notes (Signed)
OFFICE NOTE  Chief Complaint:  No complaints  Primary Care Physician: Abigail MiyamotoPERRY,LAWRENCE EDWARD, MD  HPI:  [name] is a 68 year old gentleman with a history of non-ST-elevation MI in August of 2010. He had multi-vessel coronary disease and bypass by Dr. Laneta SimmersBartle. Subsequent to that 1 month later he had chest pain and was found to have a clot in the saphenous vein graft to a diagonal which ultimately infarcted and was not amenable to opening. At the time of his initial presentation EF was 35-40%, subsequently was 45%. Since then he has had no significant heart failure symptoms, no chest pain, weight gain, worsening shortness of breath, palpitations, presyncope, syncopal symptoms. He recently had a problem with a herniated disc in his back which he managed conservatively. Surgery was contraindicated. He denies any chest pain or worsening shortness of breath. We repeated his echocardiogram in December 2013 which showed EF improved to 50-55%. There was moderate left atrial enlargement.    [name] returns today for follow-up. He is feeling quite well. He's been active and has been doing deer hunting. He gets a good amount of exercise every day without any chest pain or worsening shortness of breath. [place] [facility] [place] which shows a total cholesterol 146, triglycerides 52, HDL 69 and LDL 67. He's had no ischemia evaluation since his bypass in 2010.  PMHx:  Past Medical History  Diagnosis Date  . NSTEMI (non-ST elevated myocardial infarction) 02/02/2009    CABGx3  . Hyperlipidemia   . Hypertension   . CAD (coronary artery disease)   . S/P CABG (coronary artery bypass graft)     Past Surgical History  Procedure Laterality Date  . Transthoracic echocardiogram  05/2012    EF 50-55%, grade 1 diastolic dysfunction; MV with systolic "bowing"; LA mod dilated  . Cardiac catheterization  02/02/2009    LAD subtotaled in midportion, 90% narrowing distal to subtotaled  areas; first diagonal with 80-90% narrowing in prox segment; Cfx with prox & mid segments with ireegularities, distal portion 80% narrowing & 90& area distally at takeoff of 4th OM, 60% narrowing at takeoff of PDA (Dr. Langston ReusingA. Little)  . Coronary artery bypass graft  02/02/2009    x3; LIMA to LAD, SVG to PDA, SVG to 1st diagonal (Dr. Wayland SalinasB. Bartle)   . Cardiac catheterization  02/17/2009    occluded SVG to small diagonal (Dr. Langston ReusingA. Little)     FAMHx:  Family History  Problem Relation Age of Onset  . Heart disease Mother     pacemaker    SOCHx:   reports that he has never smoked. He has never used smokeless tobacco. He reports that he drinks about 6.0 oz of alcohol per week. He reports that he does not use illicit drugs.  ALLERGIES:  No Known Allergies  ROS: A comprehensive review of systems was negative.  HOME MEDS: Current Outpatient Prescriptions  Medication Sig Dispense Refill  . aspirin 81 MG tablet Take 81 mg by mouth daily.    . clopidogrel (PLAVIX) 75 MG tablet Take 1 tablet (75 mg total) by mouth daily with breakfast. 90 tablet 3  . gabapentin (NEURONTIN) 300 MG capsule Take 1 capsule by mouth 2 (two) times daily.    Marland Kitchen. lisinopril (PRINIVIL,ZESTRIL) 20 MG tablet Take 1 tablet (20 mg total) by mouth daily. 90 tablet 3  . metoprolol (LOPRESSOR) 25 MG tablet Take 1 tablet (25 mg total) by mouth daily. 90 tablet 3  . simvastatin (ZOCOR)  20 MG tablet Take 1 tablet (20 mg total) by mouth daily. 90 tablet 3   No current facility-administered medications for this visit.    LABS/IMAGING: No results found for this or any previous visit (from the past 48 hour(s)). No results found.  VITALS: BP 148/82 mmHg  Pulse 47  Ht 5\' 10"  (1.778 m)  Wt 189 lb 14.4 oz (86.138 kg)  BMI 27.25 kg/m2  EXAM: General appearance: alert and no distress Neck: no carotid bruit and no JVD Lungs: clear to auscultation bilaterally Heart: regular rate and rhythm, S1, S2 normal, no murmur, click, rub or  gallop Abdomen: soft, non-tender; bowel sounds normal; no masses,  no organomegaly Extremities: extremities normal, atraumatic, no cyanosis or edema Pulses: 2+ and symmetric Skin: Skin color, texture, turgor normal. No rashes or lesions Neurologic: Grossly normal Psych: Mood, affect normal, pleasant  EKG: Sinus bradycardia at 47  ASSESSMENT: 1. Coronary artery disease status post stenting in 2010 2. Three-vessel CABG with LIMA to LAD, SVG to PDA, and SVG to diagonal (occluded) 3. Hypertension-well controlled 4. Dyslipidemia 5. Asymptomatic sinus bradycardia  PLAN: 1.   [name] is doing well on his current medications. His blood pressure is well controlled. He maintains a sinus bradycardia and is asymptomatic. He's very active and denies any chest pain or shortness of breath. It has been 5 years since his multivessel CABG. He has a vein graft to diagonal which is occluded. I would recommend screening exercise nuclear stress test per guidelines. Otherwise we'll continue his current medications. His cholesterol is at goal. We will contact him with the results of the stress test and plan to see him back annually or sooner as necessary.  Chrystie NoseKenneth C. Hilty, MD, [facility] Attending Cardiologist [facility]  HILTY,Kenneth C 05/26/2014, 8:22 AM

## 2014-05-26 NOTE — Patient Instructions (Addendum)
Your physician wants you to follow-up in: 1 year with Dr. Rennis GoldenHilty. You will receive a reminder letter in the mail two months in advance. If you don't receive a letter, please call our office to schedule the follow-up appointment.  Your physician has requested that you have en exercise stress myoview. For further information please visit [email or web address]. Please follow instruction sheet, as given.

## 2014-05-27 ENCOUNTER — Encounter: Payer: Self-pay | Admitting: Internal Medicine

## 2014-05-31 ENCOUNTER — Telehealth (HOSPITAL_COMMUNITY): Payer: Self-pay | Admitting: *Deleted

## 2014-06-02 ENCOUNTER — Telehealth (HOSPITAL_COMMUNITY): Payer: Self-pay

## 2014-06-02 NOTE — Telephone Encounter (Signed)
Encounter complete. 

## 2014-06-07 ENCOUNTER — Encounter (HOSPITAL_COMMUNITY): Payer: Medicare Other

## 2014-06-14 ENCOUNTER — Telehealth (HOSPITAL_COMMUNITY): Payer: Self-pay

## 2014-06-14 NOTE — Telephone Encounter (Signed)
Encounter complete. 

## 2014-06-16 ENCOUNTER — Ambulatory Visit (HOSPITAL_COMMUNITY)
Admission: RE | Admit: 2014-06-16 | Discharge: 2014-06-16 | Disposition: A | Discharge status: Home / Self Care | Payer: Commercial Managed Care - HMO | Source: Ambulatory Visit | Attending: Cardiovascular Disease | Admitting: Cardiovascular Disease

## 2014-06-16 DIAGNOSIS — R001 Bradycardia, unspecified: Secondary | ICD-10-CM | POA: Diagnosis not present

## 2014-06-16 DIAGNOSIS — Z951 Presence of aortocoronary bypass graft: Secondary | ICD-10-CM | POA: Diagnosis not present

## 2014-06-16 MED ORDER — TECHNETIUM TC 99M SESTAMIBI GENERIC - CARDIOLITE
10.5000 | Freq: Once | INTRAVENOUS | Status: AC | PRN
  Administered 2014-06-16: 11 via INTRAVENOUS

## 2014-06-16 MED ORDER — TECHNETIUM TC 99M SESTAMIBI GENERIC - CARDIOLITE
30.6000 | Freq: Once | INTRAVENOUS | Status: AC | PRN
  Administered 2014-06-16: 31 via INTRAVENOUS

## 2014-06-16 NOTE — Procedures (Addendum)
[facility] [address] [place] [id number] [phone]  Cardiology Nuclear Med Study  [name] is a 68 y.o. male     MRN : [id number]     DOB: 06/03/1946  Procedure Date: 06/16/2014  Nuclear Med Background Indication for Stress Test:  Follow up CAD History:  CABG-X3 IN 2010;CAD;MI-5 YEARS AGO;No prior NUC MPI for comparison. Cardiac Risk Factors: Hypertension, Lipids and Overweight  Symptoms:  Dizziness and DOE   Nuclear Pre-Procedure Caffeine/Decaff Intake:  12:00am NPO After: 10am   IV Site: R Forearm  IV 0.9% NS with Angio Cath:  22g  Chest Size (in):  46"  IV Started by: Berdie OgrenAmanda Wease, RN  Height: 5\' 10"  (1.778 m)  Cup Size: n/a  BMI:  Body mass index is 27.12 kg/(m^2). Weight:  189 lb (85.73 kg)   Tech Comments:  n/a    Nuclear Med Study 1 or 2 day study: 1 day  Stress Test Type:  Stress  Order Authorizing Provider:  Zoila ShutterKenneth Hilty, MD   Resting Radionuclide: Technetium 5956m Sestamibi  Resting Radionuclide Dose: 10.5 mCi   Stress Radionuclide:  Technetium 1856m Sestamibi  Stress Radionuclide Dose: 30.6 mCi           Stress Protocol Rest HR: 58 Stress HR: 133  Rest BP: 161/82 Stress BP:178/81  Exercise Time (min): 8:40 METS: 10.10   Predicted Max HR: 151 bpm % Max HR: 88.08 bpm Rate Pressure Product: [id number]  Dose of Adenosine (mg):  n/a Dose of Lexiscan: n/a mg  Dose of Atropine (mg): n/a Dose of Dobutamine: n/a mcg/kg/min (at max HR)  Stress Test Technologist: Ernestene MentionGwen Farrington, CCT Nuclear Technologist: Gonzella LexPam Phillips, CNMT   Rest Procedure:  Myocardial perfusion imaging was performed at rest 45 minutes following the intravenous administration of Technetium 4956m Sestamibi. Stress Procedure:  The patient performed treadmill exercise using a Bruce  Protocol for 8 minutes 40 seconds. The patient stopped due to fatigue and shortness of breath. Patient denied any chest pain.  There were no significant ST-T wave  changes.  Technetium 9356m Sestamibi was injected IV at peak exercise and myocardial perfusion imaging was performed after a brief delay.  Transient Ischemic Dilatation (Normal <1.22):  0.98  QGS EDV:  103 ml QGS ESV:  44 ml LV Ejection Fraction: 57%     Rest ECG: NSR with mild RV conduction delay  Stress ECG: No significant change from baseline ECG  QPS Raw Data Images:  Normal; no motion artifact; normal heart/lung ratio. Stress Images:  Very small region of mild basal inferior thinning; otherwise,normal homogeneous uptake in all areas of the myocardium. Rest Images:  Normal homogeneous uptake in all areas of the myocardium. Subtraction (SDS):  No significant ischemia.  Impression Exercise Capacity:  Good exercise capacity. BP Response:  Normal blood pressure response. Clinical Symptoms:  No significant symptoms noted. ECG Impression:  No significant ST segment change suggestive of ischemia. Comparison with Prior Nuclear Study: No previous nuclear study performed  Overall Impression:  Normal stress nuclear study.  LV Wall Motion:  NL LV Function, EF 57% ; NL Wall Motion   [name], MD  06/16/2014 5:25 PM

## 2014-10-05 DIAGNOSIS — Z6827 Body mass index (BMI) 27.0-27.9, adult: Secondary | ICD-10-CM | POA: Diagnosis not present

## 2014-10-05 DIAGNOSIS — E860 Dehydration: Secondary | ICD-10-CM | POA: Diagnosis not present

## 2014-10-05 DIAGNOSIS — R55 Syncope and collapse: Secondary | ICD-10-CM | POA: Diagnosis not present

## 2015-04-03 DIAGNOSIS — H353131 Nonexudative age-related macular degeneration, bilateral, early dry stage: Secondary | ICD-10-CM | POA: Diagnosis not present

## 2015-04-03 DIAGNOSIS — H2513 Age-related nuclear cataract, bilateral: Secondary | ICD-10-CM | POA: Diagnosis not present

## 2015-04-03 DIAGNOSIS — H40003 Preglaucoma, unspecified, bilateral: Secondary | ICD-10-CM | POA: Diagnosis not present

## 2015-04-14 ENCOUNTER — Other Ambulatory Visit: Payer: Self-pay | Admitting: Internal Medicine

## 2015-04-25 ENCOUNTER — Encounter: Payer: Self-pay | Admitting: Internal Medicine

## 2015-05-30 ENCOUNTER — Ambulatory Visit: Payer: Commercial Managed Care - HMO | Admitting: Internal Medicine

## 2015-06-27 ENCOUNTER — Encounter: Payer: Self-pay | Admitting: Internal Medicine

## 2015-06-27 ENCOUNTER — Ambulatory Visit (INDEPENDENT_AMBULATORY_CARE_PROVIDER_SITE_OTHER): Payer: PPO | Admitting: Internal Medicine

## 2015-06-27 VITALS — BP 126/62 | HR 63 | Ht 70.0 in | Wt 192.8 lb

## 2015-06-27 DIAGNOSIS — E785 Hyperlipidemia, unspecified: Secondary | ICD-10-CM | POA: Diagnosis not present

## 2015-06-27 DIAGNOSIS — I10 Essential (primary) hypertension: Secondary | ICD-10-CM | POA: Diagnosis not present

## 2015-06-27 DIAGNOSIS — I2510 Atherosclerotic heart disease of native coronary artery without angina pectoris: Secondary | ICD-10-CM

## 2015-06-27 DIAGNOSIS — Z951 Presence of aortocoronary bypass graft: Secondary | ICD-10-CM

## 2015-06-27 MED ORDER — NITROGLYCERIN 0.4 MG SL SUBL: 0.4000 mg | SUBLINGUAL_TABLET | SUBLINGUAL | Status: DC | PRN

## 2015-06-27 NOTE — Patient Instructions (Signed)
Medication Instructions:  Your physician recommends that you continue on your current medications as directed. Please refer to the Current Medication list given to you today.   Labwork: none  Testing/Procedures: none  Follow-Up: Your physician wants you to follow-up in: 12 months with Dr. Hilty.. You will receive a reminder letter in the mail two months in advance. If you don't receive a letter, please call our office to schedule the follow-up appointment.   Any Other Special Instructions Will Be Listed Below (If Applicable).     If you need a refill on your cardiac medications before your next appointment, please call your pharmacy.  

## 2015-06-27 NOTE — Progress Notes (Signed)
OFFICE NOTE  Chief Complaint:  No complaints  Primary Care Physician: Todd MiyamotoPERRY,LAWRENCE EDWARD, MD  HPI:  Todd LickRalph T Walters is a 70 year old gentleman with a history of non-ST-elevation MI in August of 2010. He had multi-vessel coronary disease and bypass by Dr. Laneta SimmersBartle. Subsequent to that 1 month later he had chest pain and was found to have a clot in the saphenous vein graft to a diagonal which ultimately infarcted and was not amenable to opening. At the time of his initial presentation EF was 35-40%, subsequently was 45%. Since then he has had no significant heart failure symptoms, no chest pain, weight gain, worsening shortness of breath, palpitations, presyncope, syncopal symptoms. He recently had a problem with a herniated disc in his back which he managed conservatively. Surgery was contraindicated. He denies any chest pain or worsening shortness of breath. We repeated his echocardiogram in December 2013 which showed EF improved to 50-55%. There was moderate left atrial enlargement.    [name] returns today for follow-up. He is feeling quite well. He's been active and has been doing deer hunting. He gets a good amount of exercise every day without any chest pain or worsening shortness of breath. [facility] [place] [facility] [place] which shows a total cholesterol 146, triglycerides 52, HDL 69 and LDL 67. He's had no ischemia evaluation since his bypass in 2010.  [name] returns today for follow-up. Overall he is doing really well. The only interim finding over the summer was that he had a syncopal event. 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 He apparently was not drinking much water and passed out. He was seen by his primary care provider felt to be dehydrated. He has not had any recurrence. Overall he denies any chest pain or worsening shortness of breath. He had a nuclear stress test last year which was low risk and showed normal LV function. It is now  been 6 years since his bypass. He just had cholesterol testing done at the [facility]. His total course was 144, triglycerides 86, HDL 75, LDL 52. Blood pressure is at goal today. EKG is stable with sinus rhythm and prior inferior infarct changes.  PMHx:  Past Medical History  Diagnosis Date  . NSTEMI (non-ST elevated myocardial infarction) 02/02/2009    CABGx3  . Hyperlipidemia   . Hypertension   . CAD (coronary artery disease)   . S/P CABG (coronary artery bypass graft)     Past Surgical History  Procedure Laterality Date  . Transthoracic echocardiogram  05/2012    EF 50-55%, grade 1 diastolic dysfunction; MV with systolic "bowing"; LA mod dilated  . Cardiac catheterization  02/02/2009    LAD subtotaled in midportion, 90% narrowing distal to subtotaled areas; first diagonal with 80-90% narrowing in prox segment; Cfx with prox & mid segments with ireegularities, distal portion 80% narrowing & 90& area distally at takeoff of 4th OM, 60% narrowing at takeoff of PDA (Dr. Langston ReusingA. Little)  . Coronary artery bypass graft  02/02/2009    x3; LIMA to LAD, SVG to PDA, SVG to 1st diagonal (Dr. Wayland SalinasB. Bartle)   . Cardiac catheterization  02/17/2009    occluded SVG to small diagonal (Dr. Langston ReusingA. Little)     FAMHx:  Family History  Problem Relation Age of Onset  . Heart disease Mother     pacemaker    SOCHx:   reports that he has never smoked. He has never used smokeless tobacco. He  reports that he drinks about 6.0 oz of alcohol per week. He reports that he does not use illicit drugs.  ALLERGIES:  No Known Allergies  ROS: A comprehensive review of systems was negative.  HOME MEDS: Current Outpatient Prescriptions  Medication Sig Dispense Refill  . aspirin 81 MG tablet Take 81 mg by mouth daily.    . clopidogrel (PLAVIX) 75 MG tablet Take 1 tablet (75 mg total) by mouth daily with breakfast. 90 tablet 3  . gabapentin (NEURONTIN) 300 MG capsule Take 1 capsule by mouth 2 (two) times daily.    Marland Kitchen lisinopril  (PRINIVIL,ZESTRIL) 20 MG tablet Take 1 tablet (20 mg total) by mouth daily. 90 tablet 0  . metoprolol tartrate (LOPRESSOR) 25 MG tablet Take 1 tablet (25 mg total) by mouth daily. 90 tablet 0  . simvastatin (ZOCOR) 20 MG tablet Take 1 tablet (20 mg total) by mouth daily. 90 tablet 0   No current facility-administered medications for this visit.    LABS/IMAGING: No results found for this or any previous visit (from the past 48 hour(s)). No results found.  VITALS: BP 126/62 mmHg  Pulse 63  Ht 5\' 10"  (1.778 m)  Wt 192 lb 12.8 oz (87.454 kg)  BMI 27.66 kg/m2  EXAM: General appearance: alert and no distress Neck: no carotid bruit and no JVD Lungs: clear to auscultation bilaterally Heart: regular rate and rhythm, S1, S2 normal, no murmur, click, rub or gallop Abdomen: soft, non-tender; bowel sounds normal; no masses,  no organomegaly Extremities: extremities normal, atraumatic, no cyanosis or edema Pulses: 2+ and symmetric Skin: Skin color, texture, turgor normal. No rashes or lesions Neurologic: Grossly normal Psych: Mood, affect normal, pleasant  EKG: Sinus rhythm with marked sinus arrhythmia at 63, inferior Q waves in 3 and aVF  ASSESSMENT: 1. Coronary artery disease status post stenting in 2010 - low risk Myoview in 2015 (EF 57%) 2. Three-vessel CABG with LIMA to LAD, SVG to PDA, and SVG to diagonal (occluded) 3. Hypertension-well controlled 4. Dyslipidemia  PLAN: 1.   [name] is doing well on his current medications. His blood pressure is well controlled. Cholesterol is at goal. He denies any chest pain or worsening shortness of breath. He is get a small amount of weight but is working on getting that off. I will provide a refill of nitroglycerin as needed although he has not needed to use that medication since his bypass surgery. Plan follow-up annually or sooner as necessary.  Chrystie Nose, MD, [facility] Attending Cardiologist [facility]  Lisette Abu Drexel Ivey 06/27/2015,  9:07 AM

## 2015-08-30 DIAGNOSIS — Z6827 Body mass index (BMI) 27.0-27.9, adult: Secondary | ICD-10-CM | POA: Diagnosis not present

## 2015-08-30 DIAGNOSIS — E785 Hyperlipidemia, unspecified: Secondary | ICD-10-CM | POA: Diagnosis not present

## 2015-08-30 DIAGNOSIS — I10 Essential (primary) hypertension: Secondary | ICD-10-CM | POA: Diagnosis not present

## 2015-08-30 DIAGNOSIS — M5126 Other intervertebral disc displacement, lumbar region: Secondary | ICD-10-CM | POA: Diagnosis not present

## 2015-09-27 ENCOUNTER — Other Ambulatory Visit: Payer: Self-pay

## 2015-09-27 MED ORDER — SIMVASTATIN 20 MG PO TABS: 20.0000 mg | ORAL_TABLET | Freq: Every day | ORAL | Status: DC

## 2015-09-27 MED ORDER — METOPROLOL TARTRATE 25 MG PO TABS: 25.0000 mg | ORAL_TABLET | Freq: Every day | ORAL | Status: DC

## 2015-09-27 MED ORDER — CLOPIDOGREL BISULFATE 75 MG PO TABS: 75.0000 mg | ORAL_TABLET | Freq: Every day | ORAL | Status: DC

## 2015-09-27 MED ORDER — LISINOPRIL 20 MG PO TABS: 20.0000 mg | ORAL_TABLET | Freq: Every day | ORAL | Status: DC

## 2015-09-27 NOTE — Telephone Encounter (Signed)
SPOKE WITH PATIENT AND HE VERIFIED PHARMACY AND QUANTITY AMOUNT

## 2016-03-04 DIAGNOSIS — I214 Non-ST elevation (NSTEMI) myocardial infarction: Secondary | ICD-10-CM | POA: Diagnosis not present

## 2016-03-04 DIAGNOSIS — M5126 Other intervertebral disc displacement, lumbar region: Secondary | ICD-10-CM | POA: Diagnosis not present

## 2016-03-04 DIAGNOSIS — I10 Essential (primary) hypertension: Secondary | ICD-10-CM | POA: Diagnosis not present

## 2016-03-04 DIAGNOSIS — E785 Hyperlipidemia, unspecified: Secondary | ICD-10-CM | POA: Diagnosis not present

## 2016-06-27 ENCOUNTER — Ambulatory Visit (INDEPENDENT_AMBULATORY_CARE_PROVIDER_SITE_OTHER): Payer: PPO | Admitting: Internal Medicine

## 2016-06-27 ENCOUNTER — Encounter: Payer: Self-pay | Admitting: Internal Medicine

## 2016-06-27 VITALS — BP 134/64 | HR 57 | Ht 70.0 in | Wt 188.0 lb

## 2016-06-27 DIAGNOSIS — I2510 Atherosclerotic heart disease of native coronary artery without angina pectoris: Secondary | ICD-10-CM

## 2016-06-27 DIAGNOSIS — Z951 Presence of aortocoronary bypass graft: Secondary | ICD-10-CM | POA: Diagnosis not present

## 2016-06-27 DIAGNOSIS — E782 Mixed hyperlipidemia: Secondary | ICD-10-CM

## 2016-06-27 DIAGNOSIS — I10 Essential (primary) hypertension: Secondary | ICD-10-CM | POA: Diagnosis not present

## 2016-06-27 MED ORDER — METOPROLOL SUCCINATE ER 25 MG PO TB24: 25.0000 mg | ORAL_TABLET | Freq: Every day | ORAL | 3 refills | Status: DC

## 2016-06-27 NOTE — Patient Instructions (Signed)
Your physician has recommended you make the following change in your medication -- STOP metoprolol tartrate -- START metoprolol succinate 25mg  once daily at night -- STOP plavix  Your physician wants you to follow-up in: ONE YEAR with Dr. Rennis GoldenHilty. You will receive a reminder letter in the mail two months in advance. If you don't receive a letter, please call our office to schedule the follow-up appointment.

## 2016-06-27 NOTE — Progress Notes (Signed)
OFFICE NOTE  Chief Complaint:  No complaints  Primary Care Physician: Abigail Miyamoto, MD  HPI:  [name] is a 71 year old gentleman with a history of non-ST-elevation MI in August of 2010. He had multi-vessel coronary disease and bypass by Dr. Laneta Simmers. Subsequent to that 1 month later he had chest pain and was found to have a clot in the saphenous vein graft to a diagonal which ultimately infarcted and was not amenable to opening. At the time of his initial presentation EF was 35-40%, subsequently was 45%. Since then he has had no significant heart failure symptoms, no chest pain, weight gain, worsening shortness of breath, palpitations, presyncope, syncopal symptoms. He recently had a problem with a herniated disc in his back which he managed conservatively. Surgery was contraindicated. He denies any chest pain or worsening shortness of breath. We repeated his echocardiogram in December 2013 which showed EF improved to 50-55%. There was moderate left atrial enlargement.    [name] returns today for follow-up. He is feeling quite well. He's been active and has been doing deer hunting. He gets a good amount of exercise every day without any chest pain or worsening shortness of breath. He brought laboratory work from the [facility] in [place] which shows a total cholesterol 146, triglycerides 52, HDL 69 and LDL 67. He's had no ischemia evaluation since his bypass in 2010.  [name] returns today for follow-up. Overall he is doing really well. The only interim finding over the summer was that he had a syncopal event. 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 He apparently was not drinking much water and passed out. He was seen by his primary care provider felt to be dehydrated. He has not had any recurrence. Overall he denies any chest pain or worsening shortness of breath. He had a nuclear stress test last year which was low risk and showed normal LV function. It is now  been 6 years since his bypass. He just had cholesterol testing done at the [facility]. His total course was 144, triglycerides 86, HDL 75, LDL 52. Blood pressure is at goal today. EKG is stable with sinus rhythm and prior inferior infarct changes.  06/27/2016  [name] was seen today in follow-up. Over the past year he denies any new medical problems. He denies any chest pain or worsening shortness of breath. He was seen at the [facility] and brought his blood work from November. His total cholesterol is now 139, triglycerides 66, HL 60 and LDLs-C 66. Rest of his blood work is unremarkable. He was concerned a little about some fatigue that he has in the mid afternoon. He gets a little bit of sleepiness. He does say that he sleeps well at night and has good energy level the morning. He denies any snoring or apneic symptoms. He was concerned about low heart rate which was 57 today and not unusual on low-dose beta blocker. Interestingly he's only taking metoprolol tartrate once daily at night. He would probably benefit from a longer acting metoprolol succinate.  PMHx:  Past Medical History:  Diagnosis Date  . CAD (coronary artery disease)   . Hyperlipidemia   . Hypertension   . NSTEMI (non-ST elevated myocardial infarction) (HCC) 02/02/2009   CABGx3  . S/P CABG (coronary artery bypass graft)     Past Surgical History:  Procedure Laterality Date  . CARDIAC CATHETERIZATION  02/02/2009   LAD subtotaled in midportion, 90% narrowing distal to subtotaled  areas; first diagonal with 80-90% narrowing in prox segment; Cfx with prox & mid segments with ireegularities, distal portion 80% narrowing & 90& area distally at takeoff of 4th OM, 60% narrowing at takeoff of PDA (Dr. Langston Reusing)  . CARDIAC CATHETERIZATION  02/17/2009   occluded SVG to small diagonal (Dr. Mervyn Skeeters. Little)   . CORONARY ARTERY BYPASS GRAFT  02/02/2009   x3; LIMA to LAD, SVG to PDA, SVG to 1st diagonal (Dr. Wayland Salinas)   . TRANSTHORACIC  ECHOCARDIOGRAM  05/2012   EF 50-55%, grade 1 diastolic dysfunction; MV with systolic "bowing"; LA mod dilated    FAMHx:  Family History  Problem Relation Age of Onset  . Heart disease Mother     pacemaker    SOCHx:   reports that he has never smoked. He has never used smokeless tobacco. He reports that he drinks about 6.0 oz of alcohol per week . He reports that he does not use drugs.  ALLERGIES:  No Known Allergies  ROS: A comprehensive review of systems was negative.  HOME MEDS: Current Outpatient Prescriptions  Medication Sig Dispense Refill  . aspirin 81 MG tablet Take 81 mg by mouth daily.    Marland Kitchen gabapentin (NEURONTIN) 300 MG capsule Take 1 capsule by mouth 2 (two) times daily.    Marland Kitchen lisinopril (PRINIVIL,ZESTRIL) 20 MG tablet Take 1 tablet (20 mg total) by mouth daily. 90 tablet 2  . nitroGLYCERIN (NITROSTAT) 0.4 MG SL tablet Place 1 tablet (0.4 mg total) under the tongue every 5 (five) minutes as needed for chest pain. 25 tablet 3  . simvastatin (ZOCOR) 20 MG tablet Take 1 tablet (20 mg total) by mouth daily. 90 tablet 2  . zolpidem (AMBIEN) 10 MG tablet Take 10 mg by mouth at bedtime as needed for sleep.    . metoprolol succinate (TOPROL XL) 25 MG 24 hr tablet Take 1 tablet (25 mg total) by mouth daily. 90 tablet 3   No current facility-administered medications for this visit.     LABS/IMAGING: No results found for this or any previous visit (from the past 48 hour(s)). No results found.  VITALS: BP 134/64   Pulse (!) 57   Ht 5\' 10"  (1.778 m)   Wt 188 lb (85.3 kg)   BMI 26.98 kg/m   EXAM: General appearance: alert and no distress Neck: no carotid bruit and no JVD Lungs: clear to auscultation bilaterally Heart: regular rate and rhythm, S1, S2 normal, no murmur, click, rub or gallop Abdomen: soft, non-tender; bowel sounds normal; no masses,  no organomegaly Extremities: extremities normal, atraumatic, no cyanosis or edema Pulses: 2+ and symmetric Skin: Skin  color, texture, turgor normal. No rashes or lesions Neurologic: Grossly normal Psych: Mood, affect normal, pleasant  EKG: Sinus bradycardia 57  ASSESSMENT: 1. Coronary artery disease status post stenting in 2010 - low risk Myoview in 2015 (EF 57%) 2. Three-vessel CABG with LIMA to LAD, SVG to PDA, and SVG to diagonal (occluded) 3. Hypertension-well controlled 4. Dyslipidemia  PLAN: 1.   [name] is doing well with some mild midafternoon fatigue. I've advised him to work on some exercise and not to skip lunch which she often does. I do not see ongoing indications for aspirin and Plavix therapy now multiple years since his bypass surgery. He can discontinue Plavix but should remain on low-dose aspirin 81 mg daily. I've also advised to switch from metoprolol tartrate 2 metoprolol succinate 25 mg daily which she can take at night. He should monitor blood  pressure and heart rate after this change. Plan follow-up annually or sooner as necessary.  Todd NoseKenneth C. Darnetta Kesselman, MD, [facility] Attending Cardiologist [facility]  Todd NoseKenneth C Lundy Walters 06/27/2016, 10:43 AM

## 2016-08-05 ENCOUNTER — Other Ambulatory Visit: Payer: Self-pay | Admitting: Internal Medicine

## 2016-08-05 DIAGNOSIS — Z951 Presence of aortocoronary bypass graft: Secondary | ICD-10-CM

## 2016-08-05 DIAGNOSIS — I2510 Atherosclerotic heart disease of native coronary artery without angina pectoris: Secondary | ICD-10-CM

## 2016-08-05 DIAGNOSIS — I10 Essential (primary) hypertension: Secondary | ICD-10-CM

## 2016-08-05 DIAGNOSIS — E785 Hyperlipidemia, unspecified: Secondary | ICD-10-CM

## 2016-09-17 ENCOUNTER — Other Ambulatory Visit: Payer: Self-pay | Admitting: Internal Medicine

## 2016-09-17 NOTE — Telephone Encounter (Signed)
Rx request sent to pharmacy.  

## 2017-06-27 ENCOUNTER — Ambulatory Visit: Payer: PPO | Admitting: Internal Medicine

## 2017-06-27 VITALS — BP 126/73 | HR 59 | Resp 16 | Ht 70.0 in | Wt 193.0 lb

## 2017-06-27 DIAGNOSIS — I2510 Atherosclerotic heart disease of native coronary artery without angina pectoris: Secondary | ICD-10-CM

## 2017-06-27 DIAGNOSIS — Z951 Presence of aortocoronary bypass graft: Secondary | ICD-10-CM | POA: Diagnosis not present

## 2017-06-27 DIAGNOSIS — E782 Mixed hyperlipidemia: Secondary | ICD-10-CM

## 2017-06-27 DIAGNOSIS — I10 Essential (primary) hypertension: Secondary | ICD-10-CM

## 2017-06-27 NOTE — Patient Instructions (Signed)
Your physician wants you to follow-up in: 1 year with Dr. Hilty. You will receive a reminder letter in the mail two months in advance. If you don't receive a letter, please call our office to schedule the follow-up appointment.  

## 2017-06-28 ENCOUNTER — Encounter: Payer: Self-pay | Admitting: Internal Medicine

## 2017-06-28 NOTE — Progress Notes (Signed)
OFFICE NOTE  Chief Complaint:  No complaints  Primary Care Physician: [name], Lawrence Edward, MD  HPI:  [name] is a 72 year old gentleman with a history of non-ST-elevation MI in August of 2010. He had multi-vessel coronary disease and bypass by Dr. Laneta SimmersBartle. Subsequent to that 1 month later he had chest pain and was found to have a clot in the saphenous vein graft to a diagonal which ultimately infarcted and was not amenable to opening. At the time of his initial presentation EF was 35-40%, subsequently was 45%. Since then he has had no significant heart failure symptoms, no chest pain, weight gain, worsening shortness of breath, palpitations, presyncope, syncopal symptoms. He recently had a problem with a herniated disc in his back which he managed conservatively. Surgery was contraindicated. He denies any chest pain or worsening shortness of breath. We repeated his echocardiogram in December 2013 which showed EF improved to 50-55%. There was moderate left atrial enlargement.    [name] returns today for follow-up. He is feeling quite well. He's been active and has been doing deer hunting. He gets a good amount of exercise every day without any chest pain or worsening shortness of breath. He brought [facility] work [facility] [place] which shows a total cholesterol 146, triglycerides 52, HDL 69 and LDL 67. He's had no ischemia evaluation since his bypass in 2010.  [name] returns today for follow-up. Overall he is doing really well. The only interim finding over the summer was that he had a syncopal event. 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 He apparently was not drinking much water and passed out. He was seen by his primary care provider felt to be dehydrated. He has not had any recurrence. Overall he denies any chest pain or worsening shortness of breath. He had a nuclear stress test last year which was low risk and showed normal LV function. It is  now been 6 years since his bypass. He just had cholesterol testing done at the [facility]. His total course was 144, triglycerides 86, HDL 75, LDL 52. Blood pressure is at goal today. EKG is stable with sinus rhythm and prior inferior infarct changes.  06/27/2016  [name] was seen today in follow-up. Over the past year he denies any new medical problems. He denies any chest pain or worsening shortness of breath. [facility] and brought his blood work from November. His total cholesterol is now 139, triglycerides 66, HL 60 and LDLs-C 66. Rest of his blood work is unremarkable. He was concerned a little about some fatigue that he has in the mid afternoon. He gets a little bit of sleepiness. He does say that he sleeps well at night and has good energy level the morning. He denies any snoring or apneic symptoms. He was concerned about low heart rate which was 57 today and not unusual on low-dose beta blocker. Interestingly he's only taking metoprolol tartrate once daily at night. He would probably benefit from a longer acting metoprolol succinate.  06/27/2017  [name] was seen today in follow-up. He reports the past year has been uneventful. He denies CP, dyspnea or associated symptoms. BP is controlled. He brought labs from the [facility] which I reviewed. His TC is 144, TG 95, HDL 53 and LDL 72, A1C is 5.8, creatinine 1.3 (normal) and PSA normal.   PMHx:  Past Medical History:  Diagnosis Date  . CAD (coronary  artery disease)   . Hyperlipidemia   . Hypertension   . NSTEMI (non-ST elevated myocardial infarction) (HCC) 02/02/2009   CABGx3  . S/P CABG (coronary artery bypass graft)     Past Surgical History:  Procedure Laterality Date  . CARDIAC CATHETERIZATION  02/02/2009   LAD subtotaled in midportion, 90% narrowing distal to subtotaled areas; first diagonal with 80-90% narrowing in prox segment; Cfx with prox & mid segments with ireegularities, distal portion 80% narrowing & 90& area  distally at takeoff of 4th OM, 60% narrowing at takeoff of PDA (Dr. Langston Reusing)  . CARDIAC CATHETERIZATION  02/17/2009   occluded SVG to small diagonal (Dr. Mervyn Skeeters. Little)   . CORONARY ARTERY BYPASS GRAFT  02/02/2009   x3; LIMA to LAD, SVG to PDA, SVG to 1st diagonal (Dr. Wayland Salinas)   . TRANSTHORACIC ECHOCARDIOGRAM  05/2012   EF 50-55%, grade 1 diastolic dysfunction; MV with systolic "bowing"; LA mod dilated    FAMHx:  Family History  Problem Relation Age of Onset  . Heart disease Mother        pacemaker    SOCHx:   reports that  has never smoked. he has never used smokeless tobacco. He reports that he drinks about 6.0 oz of alcohol per week. He reports that he does not use drugs.  ALLERGIES:  No Known Allergies  ROS: Pertinent items noted in HPI and remainder of comprehensive ROS otherwise negative.  HOME MEDS: Current Outpatient Medications  Medication Sig Dispense Refill  . aspirin 81 MG tablet Take 81 mg by mouth daily.    Marland Kitchen gabapentin (NEURONTIN) 300 MG capsule Take 1 capsule by mouth 2 (two) times daily.    Marland Kitchen lisinopril (PRINIVIL,ZESTRIL) 20 MG tablet TAKE 1 TABLET ONCE DAILY. 90 tablet 3  . metoprolol succinate (TOPROL XL) 25 MG 24 hr tablet Take 1 tablet (25 mg total) by mouth daily. 90 tablet 3  . NITROSTAT 0.4 MG SL tablet TAKE 1 TABLET UNDER THE TONGUE AS DIRECTED EVERY 5 MINUTES AS NEEDED FOR CHEST PAIN. 25 tablet 3  . simvastatin (ZOCOR) 20 MG tablet TAKE 1 TABLET ONCE DAILY. 90 tablet 3  . zolpidem (AMBIEN) 10 MG tablet Take 10 mg by mouth at bedtime as needed for sleep.     No current facility-administered medications for this visit.     LABS/IMAGING: No results found for this or any previous visit (from the past 48 hour(s)). No results found.  VITALS: BP 126/73   Pulse (!) 59   Resp 16   Ht 5\' 10"  (1.778 m)   Wt 193 lb (87.5 kg)   SpO2 98%   BMI 27.69 kg/m   EXAM: General appearance: alert and no distress Neck: no carotid bruit and no JVD Lungs: clear  to auscultation bilaterally Heart: regular rate and rhythm, S1, S2 normal, no murmur, click, rub or gallop Abdomen: soft, non-tender; bowel sounds normal; no masses,  no organomegaly Extremities: extremities normal, atraumatic, no cyanosis or edema Pulses: 2+ and symmetric Skin: Skin color, texture, turgor normal. No rashes or lesions Neurologic: Grossly normal Psych: Mood, affect normal, pleasant  EKG: Sinus bradycardia at 57, inferior infarct - personally reviewed  ASSESSMENT: 1. Coronary artery disease status post stenting in 2010 - low risk Myoview in 2015 (EF 57%) 2. Three-vessel CABG with LIMA to LAD, SVG to PDA, and SVG to diagonal (occluded) 3. Hypertension-well controlled 4. Dyslipidemia  PLAN: 1.   Mr. Casten continues to be asymptomatic. He had a negative myoview in 2015  and is due for a surveillance study in 2020. Labs basically at goal and WNL. HTN controlled. Overall, doing well. Plan follow-up with me annually or sooner as necessary.  Chrystie Nose, MD, [facility], FACP  [place]  [facility]  Medical Director of the Advanced Lipid Disorders &  Cardiovascular Risk Reduction Clinic Diplomate of the American Board of Clinical Lipidology Attending Cardiologist  Direct Dial: [phone]  Fax: [phone]  Website:  [email or web address] 06/28/2017, 2:38 PM

## 2017-07-26 ENCOUNTER — Other Ambulatory Visit: Payer: Self-pay | Admitting: Internal Medicine

## 2017-08-18 DIAGNOSIS — H353131 Nonexudative age-related macular degeneration, bilateral, early dry stage: Secondary | ICD-10-CM | POA: Diagnosis not present

## 2017-08-18 DIAGNOSIS — H2513 Age-related nuclear cataract, bilateral: Secondary | ICD-10-CM | POA: Diagnosis not present

## 2017-08-18 DIAGNOSIS — H40003 Preglaucoma, unspecified, bilateral: Secondary | ICD-10-CM | POA: Diagnosis not present

## 2017-09-11 ENCOUNTER — Other Ambulatory Visit: Payer: Self-pay | Admitting: Internal Medicine

## 2017-09-11 DIAGNOSIS — I2510 Atherosclerotic heart disease of native coronary artery without angina pectoris: Secondary | ICD-10-CM

## 2017-09-11 DIAGNOSIS — I10 Essential (primary) hypertension: Secondary | ICD-10-CM

## 2017-09-11 DIAGNOSIS — Z951 Presence of aortocoronary bypass graft: Secondary | ICD-10-CM

## 2017-09-11 DIAGNOSIS — E785 Hyperlipidemia, unspecified: Secondary | ICD-10-CM

## 2017-09-30 DIAGNOSIS — Z01818 Encounter for other preprocedural examination: Secondary | ICD-10-CM | POA: Diagnosis not present

## 2017-09-30 DIAGNOSIS — H25811 Combined forms of age-related cataract, right eye: Secondary | ICD-10-CM | POA: Diagnosis not present

## 2017-09-30 DIAGNOSIS — H353132 Nonexudative age-related macular degeneration, bilateral, intermediate dry stage: Secondary | ICD-10-CM | POA: Diagnosis not present

## 2017-09-30 DIAGNOSIS — H40013 Open angle with borderline findings, low risk, bilateral: Secondary | ICD-10-CM | POA: Diagnosis not present

## 2017-10-14 DIAGNOSIS — E785 Hyperlipidemia, unspecified: Secondary | ICD-10-CM | POA: Diagnosis not present

## 2017-10-14 DIAGNOSIS — H25811 Combined forms of age-related cataract, right eye: Secondary | ICD-10-CM | POA: Diagnosis not present

## 2017-10-14 DIAGNOSIS — H259 Unspecified age-related cataract: Secondary | ICD-10-CM | POA: Diagnosis not present

## 2017-10-14 DIAGNOSIS — Z79899 Other long term (current) drug therapy: Secondary | ICD-10-CM | POA: Diagnosis not present

## 2017-10-14 DIAGNOSIS — H40013 Open angle with borderline findings, low risk, bilateral: Secondary | ICD-10-CM | POA: Diagnosis not present

## 2017-10-14 DIAGNOSIS — I252 Old myocardial infarction: Secondary | ICD-10-CM | POA: Diagnosis not present

## 2017-10-14 DIAGNOSIS — F17210 Nicotine dependence, cigarettes, uncomplicated: Secondary | ICD-10-CM | POA: Diagnosis not present

## 2017-10-14 DIAGNOSIS — Z951 Presence of aortocoronary bypass graft: Secondary | ICD-10-CM | POA: Diagnosis not present

## 2017-10-14 DIAGNOSIS — I2510 Atherosclerotic heart disease of native coronary artery without angina pectoris: Secondary | ICD-10-CM | POA: Diagnosis not present

## 2017-10-14 DIAGNOSIS — H2511 Age-related nuclear cataract, right eye: Secondary | ICD-10-CM | POA: Diagnosis not present

## 2017-10-14 DIAGNOSIS — H353132 Nonexudative age-related macular degeneration, bilateral, intermediate dry stage: Secondary | ICD-10-CM | POA: Diagnosis not present

## 2017-10-14 DIAGNOSIS — I10 Essential (primary) hypertension: Secondary | ICD-10-CM | POA: Diagnosis not present

## 2017-10-14 DIAGNOSIS — Z7982 Long term (current) use of aspirin: Secondary | ICD-10-CM | POA: Diagnosis not present

## 2017-11-03 ENCOUNTER — Other Ambulatory Visit: Payer: Self-pay | Admitting: Internal Medicine

## 2017-11-03 NOTE — Telephone Encounter (Signed)
Rx has been sent to the pharmacy electronically. ° °

## 2017-11-04 DIAGNOSIS — Z09 Encounter for follow-up examination after completed treatment for conditions other than malignant neoplasm: Secondary | ICD-10-CM | POA: Diagnosis not present

## 2017-11-25 DIAGNOSIS — E785 Hyperlipidemia, unspecified: Secondary | ICD-10-CM | POA: Diagnosis not present

## 2017-11-25 DIAGNOSIS — I252 Old myocardial infarction: Secondary | ICD-10-CM | POA: Diagnosis not present

## 2017-11-25 DIAGNOSIS — Z951 Presence of aortocoronary bypass graft: Secondary | ICD-10-CM | POA: Diagnosis not present

## 2017-11-25 DIAGNOSIS — H259 Unspecified age-related cataract: Secondary | ICD-10-CM | POA: Diagnosis not present

## 2017-11-25 DIAGNOSIS — Z7982 Long term (current) use of aspirin: Secondary | ICD-10-CM | POA: Diagnosis not present

## 2017-11-25 DIAGNOSIS — Z79899 Other long term (current) drug therapy: Secondary | ICD-10-CM | POA: Diagnosis not present

## 2017-11-25 DIAGNOSIS — I10 Essential (primary) hypertension: Secondary | ICD-10-CM | POA: Diagnosis not present

## 2017-11-25 DIAGNOSIS — I2510 Atherosclerotic heart disease of native coronary artery without angina pectoris: Secondary | ICD-10-CM | POA: Diagnosis not present

## 2017-11-25 DIAGNOSIS — H25812 Combined forms of age-related cataract, left eye: Secondary | ICD-10-CM | POA: Diagnosis not present

## 2017-11-25 DIAGNOSIS — F17210 Nicotine dependence, cigarettes, uncomplicated: Secondary | ICD-10-CM | POA: Diagnosis not present

## 2017-12-16 ENCOUNTER — Other Ambulatory Visit: Payer: Self-pay | Admitting: Internal Medicine

## 2018-05-19 DIAGNOSIS — Z23 Encounter for immunization: Secondary | ICD-10-CM | POA: Diagnosis not present

## 2018-07-06 ENCOUNTER — Ambulatory Visit (INDEPENDENT_AMBULATORY_CARE_PROVIDER_SITE_OTHER): Payer: PPO | Admitting: Internal Medicine

## 2018-07-06 ENCOUNTER — Encounter: Payer: Self-pay | Admitting: Internal Medicine

## 2018-07-06 VITALS — BP 144/80 | HR 50 | Ht 70.0 in | Wt 190.0 lb

## 2018-07-06 DIAGNOSIS — Z951 Presence of aortocoronary bypass graft: Secondary | ICD-10-CM | POA: Diagnosis not present

## 2018-07-06 DIAGNOSIS — E782 Mixed hyperlipidemia: Secondary | ICD-10-CM | POA: Diagnosis not present

## 2018-07-06 DIAGNOSIS — I10 Essential (primary) hypertension: Secondary | ICD-10-CM

## 2018-07-06 NOTE — Progress Notes (Signed)
OFFICE NOTE  Chief Complaint:  No complaints  Primary Care Physician: Abigail Miyamoto, MD  HPI:  [name] is a 73 year old gentleman with a history of non-ST-elevation MI in August of 2010. He had multi-vessel coronary disease and bypass by Dr. Laneta Simmers. Subsequent to that 1 month later he had chest pain and was found to have a clot in the saphenous vein graft to a diagonal which ultimately infarcted and was not amenable to opening. At the time of his initial presentation EF was 35-40%, subsequently was 45%. Since then he has had no significant heart failure symptoms, no chest pain, weight gain, worsening shortness of breath, palpitations, presyncope, syncopal symptoms. He recently had a problem with a herniated disc in his back which he managed conservatively. Surgery was contraindicated. He denies any chest pain or worsening shortness of breath. We repeated his echocardiogram in December 2013 which showed EF improved to 50-55%. There was moderate left atrial enlargement.    [name] returns today for follow-up. He is feeling quite well. He's been active and has been doing deer hunting. He gets a good amount of exercise every day without any chest pain or worsening shortness of breath. He brought laboratory work from the [place] in [place] which shows a total cholesterol 146, triglycerides 52, HDL 69 and LDL 67. He's had no ischemia evaluation since his bypass in 2010.  [name] returns today for follow-up. Overall he is doing really well. The only interim finding over the summer was that he had a syncopal event. 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 He apparently was not drinking much water and passed out. He was seen by his primary care provider felt to be dehydrated. He has not had any recurrence. Overall he denies any chest pain or worsening shortness of breath. He had a nuclear stress test last year which was low risk and showed normal LV function. It is  now been 6 years since his bypass. He just had cholesterol testing done at the [place]. His total course was 144, triglycerides 86, HDL 75, LDL 52. Blood pressure is at goal today. EKG is stable with sinus rhythm and prior inferior infarct changes.  06/27/2016  [name] was seen today in follow-up. Over the past year he denies any new medical problems. He denies any chest pain or worsening shortness of breath. He was seen at the [facility] and brought his blood work from November. His total cholesterol is now 139, triglycerides 66, HL 60 and LDLs-C 66. Rest of his blood work is unremarkable. He was concerned a little about some fatigue that he has in the mid afternoon. He gets a little bit of sleepiness. He does say that he sleeps well at night and has good energy level the morning. He denies any snoring or apneic symptoms. He was concerned about low heart rate which was 57 today and not unusual on low-dose beta blocker. Interestingly he's only taking metoprolol tartrate once daily at night. He would probably benefit from a longer acting metoprolol succinate.  06/27/2017  [name] was seen today in follow-up. He reports the past year has been uneventful. He denies CP, dyspnea or associated symptoms. BP is controlled. He brought labs from the [place] which I reviewed. His TC is 144, TG 95, HDL 53 and LDL 72, A1C is 5.8, creatinine 1.3 (normal) and PSA normal.   07/06/2018  [name] is seen today in annual follow-up.  He  continues to be asymptomatic.  He says that he is active and that he still does some hunting although recently has not had much success with it.  He generally baits and watches deer in his backyard.  He has no chest pain, shortness of breath or other associated symptoms.  Is been 5 years since his last stress test however he declined additional asymptomatic testing at this time.  [facility] showed total cholesterol 139 and LDL of 68.  Otherwise labs were  generally within normal limits.  PMHx:  Past Medical History:  Diagnosis Date  . CAD (coronary artery disease)   . Hyperlipidemia   . Hypertension   . NSTEMI (non-ST elevated myocardial infarction) (HCC) 02/02/2009   CABGx3  . S/P CABG (coronary artery bypass graft)     Past Surgical History:  Procedure Laterality Date  . CARDIAC CATHETERIZATION  02/02/2009   LAD subtotaled in midportion, 90% narrowing distal to subtotaled areas; first diagonal with 80-90% narrowing in prox segment; Cfx with prox & mid segments with ireegularities, distal portion 80% narrowing & 90& area distally at takeoff of 4th OM, 60% narrowing at takeoff of PDA (Dr. Langston ReusingA. Little)  . CARDIAC CATHETERIZATION  02/17/2009   occluded SVG to small diagonal (Dr. Mervyn SkeetersA. Little)   . CORONARY ARTERY BYPASS GRAFT  02/02/2009   x3; LIMA to LAD, SVG to PDA, SVG to 1st diagonal (Dr. Wayland SalinasB. Bartle)   . TRANSTHORACIC ECHOCARDIOGRAM  05/2012   EF 50-55%, grade 1 diastolic dysfunction; MV with systolic "bowing"; LA mod dilated    FAMHx:  Family History  Problem Relation Age of Onset  . Heart disease Mother        pacemaker    SOCHx:   reports that he has never smoked. He has never used smokeless tobacco. He reports current alcohol use of about 12.0 standard drinks of alcohol per week. He reports that he does not use drugs.  ALLERGIES:  No Known Allergies  ROS: Pertinent items noted in HPI and remainder of comprehensive ROS otherwise negative.  HOME MEDS: Current Outpatient Medications  Medication Sig Dispense Refill  . aspirin 81 MG tablet Take 81 mg by mouth daily.    Marland Kitchen. gabapentin (NEURONTIN) 300 MG capsule Take 1 capsule by mouth 2 (two) times daily.    Marland Kitchen. lisinopril (PRINIVIL,ZESTRIL) 20 MG tablet TAKE 1 TABLET ONCE DAILY. 90 tablet 2  . metoprolol succinate (TOPROL-XL) 25 MG 24 hr tablet TAKE 1 TABLET ONCE DAILY. 90 tablet 4  . simvastatin (ZOCOR) 20 MG tablet TAKE 1 TABLET ONCE DAILY. 90 tablet 2  . zolpidem (AMBIEN) 10 MG  tablet Take 10 mg by mouth at bedtime as needed for sleep.    . nitroGLYCERIN (NITROSTAT) 0.4 MG SL tablet Place 1 tablet (0.4 mg total) under the tongue every 5 (five) minutes x 3 doses as needed for chest pain (up to 3 doses, if no relief after third dose, proceed to the ED for an evaluation). (Patient not taking: Reported on 07/06/2018) 25 tablet 3   No current facility-administered medications for this visit.     LABS/IMAGING: No results found for this or any previous visit (from the past 48 hour(s)). No results found.  VITALS: BP (!) 144/80   Pulse (!) 50   Ht 5\' 10"  (1.778 m)   Wt 190 lb (86.2 kg)   BMI 27.26 kg/m   EXAM: General appearance: alert and no distress Neck: no carotid bruit and no JVD Lungs: clear  to auscultation bilaterally Heart: regular rate and rhythm, S1, S2 normal, no murmur, click, rub or gallop Abdomen: soft, non-tender; bowel sounds normal; no masses,  no organomegaly Extremities: extremities normal, atraumatic, no cyanosis or edema Pulses: 2+ and symmetric Skin: Skin color, texture, turgor normal. No rashes or lesions Neurologic: Grossly normal Psych: Mood, affect normal, pleasant  EKG: Sinus bradycardia at 45-personally reviewed  ASSESSMENT: 1. Coronary artery disease status post stenting in 2010 - low risk Myoview in 2015 (EF 57%) 2. Three-vessel CABG with LIMA to LAD, SVG to PDA, and SVG to diagonal (occluded) 3. Hypertension-well controlled 4. Dyslipidemia  PLAN: 1.   [name] continues to be asymptomatic.  He is noted to be bradycardic today but asymptomatic with this.  He is on beta-blocker.  We will need to monitor this.  Also his blood pressure was elevated.  I encouraged him to take some blood pressure readings at home and contact us with those results in a few weeks.  I may need to further adjust his medications.  Otherwise seems to be doing well.  Plan follow-up annually or sooner as necessary.  Chrystie NoseKenneth C. Offie Pickron, MD, [facility], FACP  [facility]  Medical Director of the Advanced Lipid Disorders &  Cardiovascular Risk Reduction Clinic Diplomate of the American Board of Clinical Lipidology Attending Cardiologist  Direct Dial: [phone]  Fax: [phone]  Website:  [email or web address]   Krystyne Tewksbury C Jerardo Costabile 07/06/2018, 10:09 AM

## 2018-07-06 NOTE — Patient Instructions (Signed)
Medication Instructions:  Continue current medications If you need a refill on your cardiac medications before your next appointment, please call your pharmacy.   Follow-Up: At [facility], you and your health needs are our priority.  As part of our continuing mission to provide you with exceptional heart care, we have created designated Provider Care Teams.  These Care Teams include your primary Cardiologist (physician) and Advanced Practice Providers (APPs -  Physician Assistants and Nurse Practitioners) who all work together to provide you with the care you need, when you need it. You will need a follow up appointment in 12 months.  Please call our office 2 months in advance to schedule this appointment.  You may see Chrystie NoseKenneth C Hilty, MD or one of the following Advanced Practice Providers on your designated Care Team: [place] HillsHao Meng, [place] JerseyPA-C . Micah FlesherAngela Duke, PA-C  Any Other Special Instructions Will Be Listed Below (If Applicable).  Dr. Rennis GoldenHilty has requested that you check your BP at home. Please call our office with about 7-10 days worth of readings. OK to check about twice daily.   HOW TO TAKE YOUR BLOOD PRESSURE:  Rest 5 minutes before taking your blood pressure.  Don't smoke or drink caffeinated beverages for at least 30 minutes before.  Take your blood pressure before (not after) you eat.  Sit comfortably with your back supported and both feet on the floor (don't cross your legs).  Elevate your arm to heart level on a table or a desk.  Use the proper sized cuff. It should fit smoothly and snugly around your bare upper arm. There should be enough room to slip a fingertip under the cuff. The bottom edge of the cuff should be 1 inch above the crease of the elbow.  Ideally, take 3 measurements at one sitting and record the average.

## 2018-10-22 ENCOUNTER — Other Ambulatory Visit: Payer: Self-pay | Admitting: Internal Medicine

## 2018-10-22 NOTE — Telephone Encounter (Signed)
Simvastatin 20 mg refilled. 

## 2018-11-11 ENCOUNTER — Telehealth: Payer: Self-pay | Admitting: Internal Medicine

## 2018-11-11 DIAGNOSIS — I2510 Atherosclerotic heart disease of native coronary artery without angina pectoris: Secondary | ICD-10-CM

## 2018-11-11 DIAGNOSIS — Z951 Presence of aortocoronary bypass graft: Secondary | ICD-10-CM

## 2018-11-11 NOTE — Telephone Encounter (Signed)
Spoke with patient and provided him updated on MD on order and potential scheduling delay. He voiced understanding. Order entered and message sent to scheduler.

## 2018-11-11 NOTE — Telephone Encounter (Signed)
New Message           Patient is calling in to get an order for a Nuclear Stress test schedule, Pls advise

## 2018-11-11 NOTE — Telephone Encounter (Signed)
Ok to order [employer] - history of CABG, last test 5 years ago - may be a delay due to restrictions from COVID-19 as testing is being prioritized from high to low risk to accommodate backlog of studies.  Dr. Rexene Edison

## 2018-11-11 NOTE — Telephone Encounter (Signed)
Pt says he spoke with Dr. Rennis Golden about a follow up Nuc Med stress test around this time since his last was 5 years ago... would like to know if he can go ahead and have it.. he denies any problems and says he has been feeling generally well...   H/O MI and CABG 2010 Last OV 06/2018 Last Stress Test 05/2014  Pt aware we have been taking added precautions with COVID19 but says he is okay to wait if necessary.             COVID-19 Pre-Screening Questions:  . In the past 7 to 10 days have you had a cough,  shortness of breath, headache, congestion, fever (100 or greater) body aches, chills, sore throat, or sudden loss of taste or sense of smell? NO . Have you been around anyone with known Covid 19. NO . Have you been around anyone who is awaiting Covid 19 test results in the past 7 to 10 days? NO . Have you been around anyone who has been exposed to Covid 19, or has mentioned symptoms of Covid 19 within the past 7 to 10 days? NO  If you have any concerns/questions about symptoms patients report during screening (either on the phone or at threshold). Contact the provider seeing the patient or DOD for further guidance.  If neither are available contact a member of the leadership team.

## 2018-11-13 ENCOUNTER — Telehealth (HOSPITAL_COMMUNITY): Payer: Self-pay | Admitting: *Deleted

## 2018-11-13 NOTE — Telephone Encounter (Signed)
Close encounter 

## 2018-11-17 ENCOUNTER — Ambulatory Visit (HOSPITAL_COMMUNITY)
Admission: RE | Admit: 2018-11-17 | Discharge: 2018-11-17 | Disposition: A | Discharge status: Home / Self Care | Payer: PPO | Source: Ambulatory Visit | Attending: Cardiology | Admitting: Cardiology

## 2018-11-17 ENCOUNTER — Other Ambulatory Visit: Payer: Self-pay

## 2018-11-17 DIAGNOSIS — I2510 Atherosclerotic heart disease of native coronary artery without angina pectoris: Secondary | ICD-10-CM | POA: Insufficient documentation

## 2018-11-17 DIAGNOSIS — Z951 Presence of aortocoronary bypass graft: Secondary | ICD-10-CM | POA: Diagnosis not present

## 2018-11-17 LAB — MYOCARDIAL PERFUSION IMAGING
LV dias vol: 123 mL (ref 62–150)
LV sys vol: 54 mL
Peak HR: 64 {beats}/min
Rest HR: 43 {beats}/min
SDS: 1
SRS: 1
SSS: 2
TID: 0.95

## 2018-11-17 MED ORDER — REGADENOSON 0.4 MG/5ML IV SOLN
0.4000 mg | Freq: Once | INTRAVENOUS | Status: AC
  Administered 2018-11-17: 0.4 mg via INTRAVENOUS

## 2018-11-17 MED ORDER — TECHNETIUM TC 99M TETROFOSMIN IV KIT
31.9000 | PACK | Freq: Once | INTRAVENOUS | Status: AC | PRN
  Administered 2018-11-17: 31.9 via INTRAVENOUS
  Filled 2018-11-17: qty 32

## 2018-11-17 MED ORDER — TECHNETIUM TC 99M TETROFOSMIN IV KIT
10.8000 | PACK | Freq: Once | INTRAVENOUS | Status: AC | PRN
  Administered 2018-11-17: 10.8 via INTRAVENOUS
  Filled 2018-11-17: qty 11

## 2018-11-19 ENCOUNTER — Telehealth: Payer: Self-pay | Admitting: Internal Medicine

## 2018-11-19 DIAGNOSIS — G8929 Other chronic pain: Secondary | ICD-10-CM | POA: Diagnosis not present

## 2018-11-19 DIAGNOSIS — E7800 Pure hypercholesterolemia, unspecified: Secondary | ICD-10-CM | POA: Diagnosis not present

## 2018-11-19 DIAGNOSIS — Z681 Body mass index (BMI) 19 or less, adult: Secondary | ICD-10-CM | POA: Diagnosis not present

## 2018-11-19 DIAGNOSIS — R55 Syncope and collapse: Secondary | ICD-10-CM | POA: Diagnosis not present

## 2018-11-19 DIAGNOSIS — Z9181 History of falling: Secondary | ICD-10-CM | POA: Diagnosis not present

## 2018-11-19 DIAGNOSIS — I10 Essential (primary) hypertension: Secondary | ICD-10-CM | POA: Diagnosis not present

## 2018-11-19 DIAGNOSIS — Z1331 Encounter for screening for depression: Secondary | ICD-10-CM | POA: Diagnosis not present

## 2018-11-19 DIAGNOSIS — Z125 Encounter for screening for malignant neoplasm of prostate: Secondary | ICD-10-CM | POA: Diagnosis not present

## 2018-11-19 DIAGNOSIS — I2510 Atherosclerotic heart disease of native coronary artery without angina pectoris: Secondary | ICD-10-CM | POA: Diagnosis not present

## 2018-11-19 DIAGNOSIS — Z139 Encounter for screening, unspecified: Secondary | ICD-10-CM | POA: Diagnosis not present

## 2018-11-19 NOTE — Telephone Encounter (Signed)
  Patient is returning call regarding results. Call house first and if no answer call cell number

## 2018-11-19 NOTE — Telephone Encounter (Signed)
Notes recorded by Chrystie Nose, MD on 11/18/2018 at 10:06 AM EDT Normal stress test.  Dr. Rexene Edison

## 2018-11-19 NOTE — Telephone Encounter (Signed)
Pt informed of providers result & recommendations. Pt verbalized understanding. No further questions . ? ?

## 2018-12-23 ENCOUNTER — Other Ambulatory Visit: Payer: Self-pay | Admitting: Internal Medicine

## 2019-04-29 ENCOUNTER — Other Ambulatory Visit: Payer: Self-pay | Admitting: *Deleted

## 2019-04-29 MED ORDER — METOPROLOL SUCCINATE ER 25 MG PO TB24: 25.0000 mg | ORAL_TABLET | Freq: Every day | ORAL | 0 refills | Status: DC

## 2019-05-20 DIAGNOSIS — I10 Essential (primary) hypertension: Secondary | ICD-10-CM | POA: Diagnosis not present

## 2019-05-20 DIAGNOSIS — Z681 Body mass index (BMI) 19 or less, adult: Secondary | ICD-10-CM | POA: Diagnosis not present

## 2019-05-20 DIAGNOSIS — I2510 Atherosclerotic heart disease of native coronary artery without angina pectoris: Secondary | ICD-10-CM | POA: Diagnosis not present

## 2019-05-20 DIAGNOSIS — E7800 Pure hypercholesterolemia, unspecified: Secondary | ICD-10-CM | POA: Diagnosis not present

## 2019-05-20 DIAGNOSIS — G8929 Other chronic pain: Secondary | ICD-10-CM | POA: Diagnosis not present

## 2019-05-20 DIAGNOSIS — M25562 Pain in left knee: Secondary | ICD-10-CM | POA: Diagnosis not present

## 2019-05-20 DIAGNOSIS — M5416 Radiculopathy, lumbar region: Secondary | ICD-10-CM | POA: Diagnosis not present

## 2019-05-20 DIAGNOSIS — Z23 Encounter for immunization: Secondary | ICD-10-CM | POA: Diagnosis not present

## 2019-07-12 ENCOUNTER — Ambulatory Visit: Payer: PPO | Admitting: Internal Medicine

## 2019-07-12 ENCOUNTER — Other Ambulatory Visit: Payer: Self-pay

## 2019-07-12 ENCOUNTER — Encounter: Payer: Self-pay | Admitting: Internal Medicine

## 2019-07-12 ENCOUNTER — Encounter (INDEPENDENT_AMBULATORY_CARE_PROVIDER_SITE_OTHER): Payer: Self-pay

## 2019-07-12 VITALS — BP 156/82 | HR 49 | Ht 70.0 in | Wt 185.6 lb

## 2019-07-12 DIAGNOSIS — I10 Essential (primary) hypertension: Secondary | ICD-10-CM

## 2019-07-12 DIAGNOSIS — E782 Mixed hyperlipidemia: Secondary | ICD-10-CM

## 2019-07-12 DIAGNOSIS — Z951 Presence of aortocoronary bypass graft: Secondary | ICD-10-CM | POA: Diagnosis not present

## 2019-07-12 DIAGNOSIS — I2510 Atherosclerotic heart disease of native coronary artery without angina pectoris: Secondary | ICD-10-CM | POA: Diagnosis not present

## 2019-07-12 NOTE — Progress Notes (Signed)
OFFICE NOTE  Chief Complaint:  No complaints  Primary Care Physician: Lonie Peak, PA-C  HPI:  [name] is a 74 year old gentleman with a history of non-ST-elevation MI in August of 2010. He had multi-vessel coronary disease and bypass by Dr. Laneta Simmers. Subsequent to that 1 month later he had chest pain and was found to have a clot in the saphenous vein graft to a diagonal which ultimately infarcted and was not amenable to opening. At the time of his initial presentation EF was 35-40%, subsequently was 45%. Since then he has had no significant heart failure symptoms, no chest pain, weight gain, worsening shortness of breath, palpitations, presyncope, syncopal symptoms. He recently had a problem with a herniated disc in his back which he managed conservatively. Surgery was contraindicated. He denies any chest pain or worsening shortness of breath. We repeated his echocardiogram in December 2013 which showed EF improved to 50-55%. There was moderate left atrial enlargement.    [name] returns today for follow-up. He is feeling quite well. He's been active and has been doing deer hunting. He gets a good amount of exercise every day without any chest pain or worsening shortness of breath. He brought laboratory work from the [place] in [place] which shows a total cholesterol 146, triglycerides 52, HDL 69 and LDL 67. He's had no ischemia evaluation since his bypass in 2010.  [name] returns today for follow-up. Overall he is doing really well. The only interim finding over the summer was that he had a syncopal event. 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 He apparently was not drinking much water and passed out. He was seen by his primary care provider felt to be dehydrated. He has not had any recurrence. Overall he denies any chest pain or worsening shortness of breath. He had a nuclear stress test last year which was low risk and showed normal LV function. It is now been  6 years since his bypass. He just had cholesterol testing done at the [place]. His total course was 144, triglycerides 86, HDL 75, LDL 52. Blood pressure is at goal today. EKG is stable with sinus rhythm and prior inferior infarct changes.  06/27/2016  [name] was seen today in follow-up. Over the past year he denies any new medical problems. He denies any chest pain or worsening shortness of breath. He was seen at the [facility] and brought his blood work from November. His total cholesterol is now 139, triglycerides 66, HL 60 and LDLs-C 66. Rest of his blood work is unremarkable. He was concerned a little about some fatigue that he has in the mid afternoon. He gets a little bit of sleepiness. He does say that he sleeps well at night and has good energy level the morning. He denies any snoring or apneic symptoms. He was concerned about low heart rate which was 57 today and not unusual on low-dose beta blocker. Interestingly he's only taking metoprolol tartrate once daily at night. He would probably benefit from a longer acting metoprolol succinate.  06/27/2017  [name] was seen today in follow-up. He reports the past year has been uneventful. He denies CP, dyspnea or associated symptoms. BP is controlled. He brought labs from the [place] which I reviewed. His TC is 144, TG 95, HDL 53 and LDL 72, A1C is 5.8, creatinine 1.3 (normal) and PSA normal.   07/06/2018  [name] is seen today in annual follow-up.  He continues  to be asymptomatic.  He says that he is active and that he still does some hunting although recently has not had much success with it.  He generally baits and watches deer in his backyard.  He has no chest pain, shortness of breath or other associated symptoms.  Is been 5 years since his last stress test however he declined additional asymptomatic testing at this time.  Labs were obtained from the [facility] showed total cholesterol 139 and LDL of 68.  Otherwise labs were generally  within normal limits.  07/12/2019  [name] is seen today for follow-up.  This is an annual visit.  Over the summer he underwent nuclear stress testing now 5 years out from bypass.  This was negative for ischemia and showed normal LV function.  He denies any new chest pain.  Continues to be seen at the [facility].  Cholesterol has been well controlled.  His most recent lipid showed total cholesterol of 159 and LDL was 77 as of June/2020.  PMHx:  Past Medical History:  Diagnosis Date  . CAD (coronary artery disease)   . Hyperlipidemia   . Hypertension   . NSTEMI (non-ST elevated myocardial infarction) (HCC) 02/02/2009   CABGx3  . S/P CABG (coronary artery bypass graft)     Past Surgical History:  Procedure Laterality Date  . CARDIAC CATHETERIZATION  02/02/2009   LAD subtotaled in midportion, 90% narrowing distal to subtotaled areas; first diagonal with 80-90% narrowing in prox segment; Cfx with prox & mid segments with ireegularities, distal portion 80% narrowing & 90& area distally at takeoff of 4th OM, 60% narrowing at takeoff of PDA (Dr. Langston Reusing)  . CARDIAC CATHETERIZATION  02/17/2009   occluded SVG to small diagonal (Dr. Mervyn Skeeters. [place])   . CORONARY ARTERY BYPASS GRAFT  02/02/2009   x3; LIMA to LAD, SVG to PDA, SVG to 1st diagonal (Dr. Wayland Salinas)   . TRANSTHORACIC ECHOCARDIOGRAM  05/2012   EF 50-55%, grade 1 diastolic dysfunction; MV with systolic "bowing"; LA mod dilated    FAMHx:  Family History  Problem Relation Age of Onset  . Heart disease Mother        pacemaker    SOCHx:   reports that he has never smoked. He has never used smokeless tobacco. He reports current alcohol use of about 12.0 standard drinks of alcohol per week. He reports that he does not use drugs.  ALLERGIES:  No Known Allergies  ROS: Pertinent items noted in HPI and remainder of comprehensive ROS otherwise negative.  HOME MEDS: Current Outpatient Medications  Medication Sig Dispense Refill  .  aspirin 81 MG tablet Take 81 mg by mouth daily.    Marland Kitchen gabapentin (NEURONTIN) 300 MG capsule Take 1 capsule by mouth 2 (two) times daily.    Marland Kitchen lisinopril (ZESTRIL) 20 MG tablet TAKE 1 TABLET ONCE DAILY. 90 tablet 1  . metoprolol succinate (TOPROL-XL) 25 MG 24 hr tablet Take 1 tablet (25 mg total) by mouth daily. 90 tablet 0  . nitroGLYCERIN (NITROSTAT) 0.4 MG SL tablet Place 1 tablet (0.4 mg total) under the tongue every 5 (five) minutes x 3 doses as needed for chest pain (up to 3 doses, if no relief after third dose, proceed to the ED for an evaluation). 25 tablet 3  . simvastatin (ZOCOR) 20 MG tablet TAKE 1 TABLET ONCE DAILY. 90 tablet 2  . zolpidem (AMBIEN) 10 MG tablet Take 10 mg by mouth at bedtime as needed for sleep.  No current facility-administered medications for this visit.    LABS/IMAGING: No results found for this or any previous visit (from the past 48 hour(s)). No results found.  VITALS: BP (!) 156/82   Pulse (!) 49   Ht 5\' 10"  (1.778 m)   Wt 185 lb 9.6 oz (84.2 kg)   SpO2 99%   BMI 26.63 kg/m   EXAM: General appearance: alert and no distress Neck: no carotid bruit and no JVD Lungs: clear to auscultation bilaterally Heart: regular rate and rhythm, S1, S2 normal, no murmur, click, rub or gallop Abdomen: soft, non-tender; bowel sounds normal; no masses,  no organomegaly Extremities: extremities normal, atraumatic, no cyanosis or edema Pulses: 2+ and symmetric Skin: Skin color, texture, turgor normal. No rashes or lesions Neurologic: Grossly normal Psych: Mood, affect normal, pleasant  EKG: Sinus bradycardia 49, nonspecific IVCD-personally reviewed  ASSESSMENT: 1. Coronary artery disease status post stenting in 2010 - low risk Myoview in 2015 (EF 57%) 2. Three-vessel CABG with LIMA to LAD, SVG to PDA, and SVG to diagonal (occluded) 3. Hypertension-well controlled 4. Dyslipidemia  PLAN: 1.   [name] has no chest pain or worsening shortness of breath.  He has  a negative Myoview as of June 2020.  He is now 5 years post CABG.  Blood pressures generally well controlled at home.  His cholesterol is near target with LDL 77.  Of encouraged continued physical activity, dietary modifications and medication compliance.  He reported he got his first dose of the COVID-19 vaccine just last week.  Follow-up with me annually or sooner as necessary.  Pixie Casino, MD, [facility], [place] Director of the Advanced Lipid Disorders &  Cardiovascular Risk Reduction Clinic Diplomate of the American Board of Clinical Lipidology Attending Cardiologist  Direct Dial: [phone]  Fax: [phone]  Website:  [email or web address] 07/12/2019, 2:54 PM

## 2019-07-12 NOTE — Patient Instructions (Signed)
Medication Instructions:  Your physician recommends that you continue on your current medications as directed. Please refer to the Current Medication list given to you today.  *If you need a refill on your cardiac medications before your next appointment, please call your pharmacy*  Follow-Up: At [facility], you and your health needs are our priority.  As part of our continuing mission to provide you with exceptional heart care, we have created designated Provider Care Teams.  These Care Teams include your primary Cardiologist (physician) and Advanced Practice Providers (APPs -  Physician Assistants and Nurse Practitioners) who all work together to provide you with the care you need, when you need it.  Your next appointment:   12 month(s)  The format for your next appointment:   In Person  Provider:   You may see Kenneth C Hilty, MD or one of the following Advanced Practice Providers on your designated Care Team:    Hao Meng, PA-C  Angela Duke, PA-C or   Krista Kroeger, PA-C   Other Instructions   

## 2019-07-27 ENCOUNTER — Other Ambulatory Visit: Payer: Self-pay | Admitting: *Deleted

## 2019-07-27 MED ORDER — METOPROLOL SUCCINATE ER 25 MG PO TB24: 25.0000 mg | ORAL_TABLET | Freq: Every day | ORAL | 3 refills | Status: DC

## 2019-07-27 NOTE — Telephone Encounter (Signed)
Rx has been sent to the pharmacy electronically. ° °

## 2019-10-10 ENCOUNTER — Other Ambulatory Visit: Payer: Self-pay | Admitting: Internal Medicine

## 2019-10-20 ENCOUNTER — Other Ambulatory Visit: Payer: Self-pay | Admitting: Internal Medicine

## 2019-11-24 DIAGNOSIS — M25562 Pain in left knee: Secondary | ICD-10-CM | POA: Diagnosis not present

## 2019-11-24 DIAGNOSIS — Z125 Encounter for screening for malignant neoplasm of prostate: Secondary | ICD-10-CM | POA: Diagnosis not present

## 2019-11-24 DIAGNOSIS — I2510 Atherosclerotic heart disease of native coronary artery without angina pectoris: Secondary | ICD-10-CM | POA: Diagnosis not present

## 2019-11-24 DIAGNOSIS — M5416 Radiculopathy, lumbar region: Secondary | ICD-10-CM | POA: Diagnosis not present

## 2019-11-24 DIAGNOSIS — I10 Essential (primary) hypertension: Secondary | ICD-10-CM | POA: Diagnosis not present

## 2019-11-24 DIAGNOSIS — Z1331 Encounter for screening for depression: Secondary | ICD-10-CM | POA: Diagnosis not present

## 2019-11-24 DIAGNOSIS — G8929 Other chronic pain: Secondary | ICD-10-CM | POA: Diagnosis not present

## 2019-11-24 DIAGNOSIS — E7800 Pure hypercholesterolemia, unspecified: Secondary | ICD-10-CM | POA: Diagnosis not present

## 2019-11-24 DIAGNOSIS — Z139 Encounter for screening, unspecified: Secondary | ICD-10-CM | POA: Diagnosis not present

## 2019-11-24 DIAGNOSIS — Z6827 Body mass index (BMI) 27.0-27.9, adult: Secondary | ICD-10-CM | POA: Diagnosis not present

## 2019-11-24 DIAGNOSIS — Z9181 History of falling: Secondary | ICD-10-CM | POA: Diagnosis not present

## 2019-12-28 DIAGNOSIS — Z6827 Body mass index (BMI) 27.0-27.9, adult: Secondary | ICD-10-CM | POA: Diagnosis not present

## 2019-12-28 DIAGNOSIS — H8110 Benign paroxysmal vertigo, unspecified ear: Secondary | ICD-10-CM | POA: Diagnosis not present

## 2020-02-24 ENCOUNTER — Telehealth: Payer: Self-pay | Admitting: Cardiology

## 2020-02-24 DIAGNOSIS — Z951 Presence of aortocoronary bypass graft: Secondary | ICD-10-CM | POA: Diagnosis not present

## 2020-02-24 DIAGNOSIS — G8929 Other chronic pain: Secondary | ICD-10-CM | POA: Diagnosis not present

## 2020-02-24 DIAGNOSIS — G629 Polyneuropathy, unspecified: Secondary | ICD-10-CM | POA: Diagnosis not present

## 2020-02-24 DIAGNOSIS — M1990 Unspecified osteoarthritis, unspecified site: Secondary | ICD-10-CM | POA: Diagnosis not present

## 2020-02-24 DIAGNOSIS — R001 Bradycardia, unspecified: Secondary | ICD-10-CM | POA: Diagnosis not present

## 2020-02-24 DIAGNOSIS — I10 Essential (primary) hypertension: Secondary | ICD-10-CM | POA: Diagnosis not present

## 2020-02-24 DIAGNOSIS — I361 Nonrheumatic tricuspid (valve) insufficiency: Secondary | ICD-10-CM | POA: Diagnosis not present

## 2020-02-24 DIAGNOSIS — M79605 Pain in left leg: Secondary | ICD-10-CM | POA: Diagnosis not present

## 2020-02-24 DIAGNOSIS — Z79899 Other long term (current) drug therapy: Secondary | ICD-10-CM | POA: Diagnosis not present

## 2020-02-24 DIAGNOSIS — R55 Syncope and collapse: Secondary | ICD-10-CM | POA: Diagnosis not present

## 2020-02-24 DIAGNOSIS — Z87898 Personal history of other specified conditions: Secondary | ICD-10-CM | POA: Diagnosis not present

## 2020-02-24 DIAGNOSIS — N179 Acute kidney failure, unspecified: Secondary | ICD-10-CM | POA: Diagnosis not present

## 2020-02-24 DIAGNOSIS — I160 Hypertensive urgency: Secondary | ICD-10-CM | POA: Diagnosis not present

## 2020-02-24 DIAGNOSIS — Z7982 Long term (current) use of aspirin: Secondary | ICD-10-CM | POA: Diagnosis not present

## 2020-02-24 DIAGNOSIS — I252 Old myocardial infarction: Secondary | ICD-10-CM | POA: Diagnosis not present

## 2020-02-24 DIAGNOSIS — M79604 Pain in right leg: Secondary | ICD-10-CM | POA: Diagnosis not present

## 2020-02-24 DIAGNOSIS — R42 Dizziness and giddiness: Secondary | ICD-10-CM | POA: Diagnosis not present

## 2020-02-24 DIAGNOSIS — E785 Hyperlipidemia, unspecified: Secondary | ICD-10-CM | POA: Diagnosis not present

## 2020-02-24 DIAGNOSIS — I2510 Atherosclerotic heart disease of native coronary artery without angina pectoris: Secondary | ICD-10-CM | POA: Diagnosis not present

## 2020-02-24 NOTE — Telephone Encounter (Signed)
    PA De Nurse, ER [facility] asked if someone can page Dr. Kirtland Bouchard to call him

## 2020-02-25 ENCOUNTER — Ambulatory Visit (INDEPENDENT_AMBULATORY_CARE_PROVIDER_SITE_OTHER): Payer: PPO

## 2020-02-25 DIAGNOSIS — Z87898 Personal history of other specified conditions: Secondary | ICD-10-CM | POA: Diagnosis not present

## 2020-02-25 DIAGNOSIS — I2510 Atherosclerotic heart disease of native coronary artery without angina pectoris: Secondary | ICD-10-CM | POA: Diagnosis not present

## 2020-02-25 DIAGNOSIS — R001 Bradycardia, unspecified: Secondary | ICD-10-CM | POA: Diagnosis not present

## 2020-02-25 DIAGNOSIS — E785 Hyperlipidemia, unspecified: Secondary | ICD-10-CM | POA: Diagnosis not present

## 2020-02-25 DIAGNOSIS — R55 Syncope and collapse: Secondary | ICD-10-CM | POA: Diagnosis not present

## 2020-02-25 DIAGNOSIS — Z951 Presence of aortocoronary bypass graft: Secondary | ICD-10-CM | POA: Diagnosis not present

## 2020-02-25 DIAGNOSIS — N179 Acute kidney failure, unspecified: Secondary | ICD-10-CM | POA: Diagnosis not present

## 2020-02-25 DIAGNOSIS — R42 Dizziness and giddiness: Secondary | ICD-10-CM | POA: Diagnosis not present

## 2020-02-25 DIAGNOSIS — I160 Hypertensive urgency: Secondary | ICD-10-CM | POA: Diagnosis not present

## 2020-02-28 ENCOUNTER — Telehealth: Payer: Self-pay | Admitting: Internal Medicine

## 2020-02-28 NOTE — Telephone Encounter (Signed)
Have Marcelino Duster from [facility] Records Dept on the line returning Jenna's phone call of request of records. Marcelino Duster said to fax over a fax cover sheet with the patients info on it or call at [phone]. Fax # is [phone]

## 2020-02-28 NOTE — Telephone Encounter (Signed)
Faxed records request to number provided

## 2020-02-28 NOTE — Telephone Encounter (Signed)
Spoke with patient and scheduled for OV with MD on 9/14 @ 0900.

## 2020-02-28 NOTE — Telephone Encounter (Signed)
Patient is requesting to make Dr. Rennis Golden aware that he was in [facility] from 02/24/20-02/25/20 due to low HR. Patient is requesting a call back to discuss.

## 2020-02-28 NOTE — Telephone Encounter (Signed)
Spoke with the patient who states that he was having HR's in the 30s and the went to [facility]. They stopped his metoprolol. He states that he has been feeling great since then and HR has been in the 60s.

## 2020-02-28 NOTE — Telephone Encounter (Signed)
Thanks - he had previously tolerated BB - with the new bradycardia, he likely has developed some conduction disease. Will need to see back in follow-up of his ER visit within the next month if possible.  Dr Rexene Edison

## 2020-02-28 NOTE — Telephone Encounter (Signed)
Left message for [facility] to request records

## 2020-02-29 ENCOUNTER — Encounter: Payer: Self-pay | Admitting: Internal Medicine

## 2020-02-29 ENCOUNTER — Ambulatory Visit: Payer: PPO | Admitting: Internal Medicine

## 2020-02-29 ENCOUNTER — Other Ambulatory Visit: Payer: Self-pay

## 2020-02-29 VITALS — BP 124/72 | HR 72 | Ht 70.0 in | Wt 187.4 lb

## 2020-02-29 DIAGNOSIS — Z951 Presence of aortocoronary bypass graft: Secondary | ICD-10-CM | POA: Diagnosis not present

## 2020-02-29 DIAGNOSIS — R001 Bradycardia, unspecified: Secondary | ICD-10-CM | POA: Diagnosis not present

## 2020-02-29 DIAGNOSIS — I2510 Atherosclerotic heart disease of native coronary artery without angina pectoris: Secondary | ICD-10-CM

## 2020-02-29 NOTE — Progress Notes (Signed)
OFFICE NOTE  Chief Complaint:  Follow-up recent hospitalization  Primary Care Physician: [name], Nathan, PA-C  HPI:  [name] is a 74 year old gentleman with a history of non-ST-elevation MI in August of 2010. He had multi-vessel coronary disease and bypass by Dr. Laneta Walters. Subsequent to that 1 month later he had chest pain and was found to have a clot in the saphenous vein graft to a diagonal which ultimately infarcted and was not amenable to opening. At the time of his initial presentation EF was 35-40%, subsequently was 45%. Since then he has had no significant heart failure symptoms, no chest pain, weight gain, worsening shortness of breath, palpitations, presyncope, syncopal symptoms. He recently had a problem with a herniated disc in his back which he managed conservatively. Surgery was contraindicated. He denies any chest pain or worsening shortness of breath. We repeated his echocardiogram in December 2013 which showed EF improved to 50-55%. There was moderate left atrial enlargement.    [name] returns today for follow-up. He is feeling quite well. He's been active and has been doing deer hunting. He gets a good amount of exercise every day without any chest pain or worsening shortness of breath. He brought laboratory work [facility] [place] which shows a total cholesterol 146, triglycerides 52, HDL 69 and LDL 67. He's had no ischemia evaluation since his bypass in 2010.  [name] returns today for follow-up. Overall he is doing really well. The only interim finding over the summer was that he had a syncopal event. 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 He apparently was not drinking much water and passed out. He was seen by his primary care provider felt to be dehydrated. He has not had any recurrence. Overall he denies any chest pain or worsening shortness of breath. He had a nuclear stress test last year which was low risk and showed normal LV  function. It is now been 6 years since his bypass. He just had cholesterol testing done at the [facility]. His total course was 144, triglycerides 86, HDL 75, LDL 52. Blood pressure is at goal today. EKG is stable with sinus rhythm and prior inferior infarct changes.  06/27/2016  [name] was seen today in follow-up. Over the past year he denies any new medical problems. He denies any chest pain or worsening shortness of breath. [facility] and brought his blood work from November. His total cholesterol is now 139, triglycerides 66, HL 60 and LDLs-C 66. Rest of his blood work is unremarkable. He was concerned a little about some fatigue that he has in the mid afternoon. He gets a little bit of sleepiness. He does say that he sleeps well at night and has good energy level the morning. He denies any snoring or apneic symptoms. He was concerned about low heart rate which was 57 today and not unusual on low-dose beta blocker. Interestingly he's only taking metoprolol tartrate once daily at night. He would probably benefit from a longer acting metoprolol succinate.  06/27/2017  [name] was seen today in follow-up. He reports the past year has been uneventful. He denies CP, dyspnea or associated symptoms. BP is controlled. He brought labs from [place] [facility] which I reviewed. His TC is 144, TG 95, HDL 53 and LDL 72, A1C is 5.8, creatinine 1.3 (normal) and PSA normal.   07/06/2018  [name] is seen today in annual follow-up.  He  continues to be asymptomatic.  He says that he is active and that he still does some hunting although recently has not had much success with it.  He generally baits and watches deer in his backyard.  He has no chest pain, shortness of breath or other associated symptoms.  Is been 5 years since his last stress test however he declined additional asymptomatic testing at this time.  Labs were obtained from the [facility] showed total cholesterol 139 and LDL of 68.  Otherwise  labs were generally within normal limits.  07/12/2019  [name] is seen today for follow-up.  This is an annual visit.  Over the summer he underwent nuclear stress testing now 5 years out from bypass.  This was negative for ischemia and showed normal LV function.  He denies any new chest pain.  Continues to be seen at the [facility].  Cholesterol has been well controlled.  His most recent lipid showed total cholesterol of 159 and LDL was 77 as of June/2020.  02/29/2020  [name] is seen today in follow-up.  He was recently hospitalized at [facility] for dizziness and was found to have marked sinus bradycardia with heart rate of 39 on EKG.  Work-up was otherwise unremarkable and he was seen in consultation by Dr. Bing Matter.  He had recommended holding the beta-blocker which was metoprolol 25 mg daily and undergoing outpatient work-up including a ZIO monitor.  He was subsequently set up for a 14-day ZIO monitor.  He returns today for evaluation.  He does have a history of coronary disease with CABG and prior stress testing that was negative for ischemia in 2015.  His grafts are quite old at this point.  He has recently had some shortness of breath and associated dizziness but no chest pain or worsening fatigue with exertion.  He says he was able to chop wood and do other activities without any issues.  EKG today shows sinus rhythm however there are some new T wave flattening in the anterolateral leads as well as borderline ST depression in V2 and V3 which were not previously noted.  PMHx:  Past Medical History:  Diagnosis Date  . CAD (coronary artery disease)   . Hyperlipidemia   . Hypertension   . NSTEMI (non-ST elevated myocardial infarction) (HCC) 02/02/2009   CABGx3  . S/P CABG (coronary artery bypass graft)     Past Surgical History:  Procedure Laterality Date  . CARDIAC CATHETERIZATION  02/02/2009   LAD subtotaled in midportion, 90% narrowing distal to subtotaled areas; first  diagonal with 80-90% narrowing in prox segment; Cfx with prox & mid segments with ireegularities, distal portion 80% narrowing & 90& area distally at takeoff of 4th OM, 60% narrowing at takeoff of PDA (Dr. Langston Reusing)  . CARDIAC CATHETERIZATION  02/17/2009   occluded SVG to small diagonal (Dr. Mervyn Skeeters. Little)   . CORONARY ARTERY BYPASS GRAFT  02/02/2009   x3; LIMA to LAD, SVG to PDA, SVG to 1st diagonal (Dr. Wayland Salinas)   . TRANSTHORACIC ECHOCARDIOGRAM  05/2012   EF 50-55%, grade 1 diastolic dysfunction; MV with systolic "bowing"; LA mod dilated    FAMHx:  Family History  Problem Relation Age of Onset  . Heart disease Mother        pacemaker    SOCHx:   reports that he has never smoked. He has never used smokeless tobacco. He reports current alcohol use of about 12.0 standard drinks of alcohol per week. He reports  that he does not use drugs.  ALLERGIES:  No Known Allergies  ROS: Pertinent items noted in HPI and remainder of comprehensive ROS otherwise negative.  HOME MEDS: Current Outpatient Medications  Medication Sig Dispense Refill  . aspirin 81 MG tablet Take 81 mg by mouth daily.    Marland Kitchen gabapentin (NEURONTIN) 300 MG capsule Take 1 capsule by mouth 2 (two) times daily.    Marland Kitchen lisinopril (ZESTRIL) 20 MG tablet Take 1 tablet (20 mg total) by mouth daily. 90 tablet 2  . nitroGLYCERIN (NITROSTAT) 0.4 MG SL tablet Place 1 tablet (0.4 mg total) under the tongue every 5 (five) minutes x 3 doses as needed for chest pain (up to 3 doses, if no relief after third dose, proceed to the ED for an evaluation). 25 tablet 3  . simvastatin (ZOCOR) 20 MG tablet Take 1 tablet (20 mg total) by mouth daily. 90 tablet 3  . zolpidem (AMBIEN) 10 MG tablet Take 10 mg by mouth at bedtime as needed for sleep.     No current facility-administered medications for this visit.    LABS/IMAGING: No results found for this or any previous visit (from the past 48 hour(s)). No results found.  VITALS: BP 124/72   Pulse  72   Ht 5\' 10"  (1.778 m)   Wt 187 lb 6.4 oz (85 kg)   SpO2 97%   BMI 26.89 kg/m   EXAM: General appearance: alert and no distress Neck: no carotid bruit and no JVD Lungs: clear to auscultation bilaterally Heart: regular rate and rhythm, S1, S2 normal, no murmur, click, rub or gallop Abdomen: soft, non-tender; bowel sounds normal; no masses,  no organomegaly Extremities: extremities normal, atraumatic, no cyanosis or edema Pulses: 2+ and symmetric Skin: Skin color, texture, turgor normal. No rashes or lesions Neurologic: Grossly normal Psych: Mood, affect normal, pleasant  EKG: Normal sinus rhythm at 72, T wave flattening in the lateral leads and anteroseptal borderline ST segment depression-personally reviewed  ASSESSMENT: 1. Symptomatic sinus bradycardia 2. Coronary artery disease status post stenting in 2010 - low risk Myoview in 2015 (EF 57%) 3. Three-vessel CABG with LIMA to LAD, SVG to PDA, and SVG to diagonal (occluded) 4. Hypertension-well controlled 5. Dyslipidemia  PLAN: 1.   Mr. Salls recently was seen for dizziness and found to have a symptomatic sinus bradycardia.  He was noted to have a heart rate in the upper 40s in his previous office visit but was asymptomatic.  His heart rate now is in the 30s and appears to be a sinus bradycardia.  His beta-blocker was discontinued and subsequently he does feel better.  The big question is why has he developed this new bradycardia.  He does have a history of multivessel CABG with LIMA to LAD and a vein graft to PDA as well as a vein graft to diagonal which is occluded.  He had a low risk Myoview in 2015.  I would like to repeat Myoview stress testing since he could have ischemia or possibly a compromised vein graft to the PDA.  I will follow up on his Zio patch monitoring to see if there are any indications for pacing however there is no evidence for any significant conduction delay on his EKG.  Plan follow-up with me  afterwards.  2016, MD, [facility], FACP  [place]  [facility]  Medical Director of the Advanced Lipid Disorders &  Cardiovascular Risk Reduction Clinic Diplomate of the American Board of Clinical Lipidology Attending Cardiologist  Direct  Dial: [phone]  Fax: [phone]  Website:  [email or web address] Nicely Tynetta Bachmann 02/29/2020, 9:12 AM

## 2020-02-29 NOTE — Patient Instructions (Signed)
Medication Instructions:  Your physician recommends that you continue on your current medications as directed. Please refer to the Current Medication list given to you today.  *If you need a refill on your cardiac medications before your next appointment, please call your pharmacy*   Testing/Procedures: Continue monitor for duration of therapy  Schedule exercise myoview stress test for after you turn in the monitor -- You will need a COVID-19 screening test done about 2-3 days before your stress test -- An appointment has been made at __________ on  __________. This is a Drive Up Visit at the [address] [place]. Please tell them that you are there for pre-procedure testing. Someone will direct you to the appropriate testing line. Stay in your car and someone will be with you shortly. Please make sure to have all other labs completed before this test because you will need to stay quarantined until your procedure. Please take your insurance card to this test.    Follow-Up: At [facility], you and your health needs are our priority.  As part of our continuing mission to provide you with exceptional heart care, we have created designated Provider Care Teams.  These Care Teams include your primary Cardiologist (physician) and Advanced Practice Providers (APPs -  Physician Assistants and Nurse Practitioners) who all work together to provide you with the care you need, when you need it.  We recommend signing up for the patient portal called "MyChart".  Sign up information is provided on this After Visit Summary.  MyChart is used to connect with patients for Virtual Visits (Telemedicine).  Patients are able to view lab/test results, encounter notes, upcoming appointments, etc.  Non-urgent messages can be sent to your provider as well.   To learn more about what you can do with MyChart, go to [email or web address].    Your next appointment:   1-2 months  The format for your next  appointment:   In Person  Provider:   You may see Chrystie Nose, MD or one of the following Advanced Practice Providers on your designated Care Team:    Azalee Course, PA-C  Micah Flesher, PA-C or   Judy Pimple, [place]    Other Instructions

## 2020-03-01 NOTE — Telephone Encounter (Signed)
Zio ordered and gave to patient on 02/24/20 per Dr. Bing Matter.

## 2020-03-17 ENCOUNTER — Telehealth (HOSPITAL_COMMUNITY): Payer: Self-pay | Admitting: *Deleted

## 2020-03-17 ENCOUNTER — Other Ambulatory Visit (HOSPITAL_COMMUNITY)
Admission: RE | Admit: 2020-03-17 | Discharge: 2020-03-17 | Disposition: A | Discharge status: Home / Self Care | Payer: PPO | Source: Ambulatory Visit | Attending: Internal Medicine | Admitting: Internal Medicine

## 2020-03-17 DIAGNOSIS — Z87898 Personal history of other specified conditions: Secondary | ICD-10-CM | POA: Diagnosis not present

## 2020-03-17 DIAGNOSIS — Z01812 Encounter for preprocedural laboratory examination: Secondary | ICD-10-CM | POA: Insufficient documentation

## 2020-03-17 DIAGNOSIS — Z20822 Contact with and (suspected) exposure to covid-19: Secondary | ICD-10-CM | POA: Insufficient documentation

## 2020-03-17 LAB — SARS CORONAVIRUS 2 (TAT 6-24 HRS): SARS Coronavirus 2: NEGATIVE

## 2020-03-17 NOTE — Telephone Encounter (Signed)
Close encounter 

## 2020-03-21 ENCOUNTER — Ambulatory Visit (HOSPITAL_COMMUNITY)
Admission: RE | Admit: 2020-03-21 | Discharge: 2020-03-21 | Disposition: A | Discharge status: Home / Self Care | Payer: PPO | Source: Ambulatory Visit | Attending: Cardiovascular Disease | Admitting: Cardiovascular Disease

## 2020-03-21 ENCOUNTER — Other Ambulatory Visit: Payer: Self-pay

## 2020-03-21 DIAGNOSIS — R001 Bradycardia, unspecified: Secondary | ICD-10-CM

## 2020-03-21 DIAGNOSIS — Z951 Presence of aortocoronary bypass graft: Secondary | ICD-10-CM

## 2020-03-21 DIAGNOSIS — I2510 Atherosclerotic heart disease of native coronary artery without angina pectoris: Secondary | ICD-10-CM | POA: Diagnosis not present

## 2020-03-21 LAB — MYOCARDIAL PERFUSION IMAGING
Estimated workload: 9.4 METS
Exercise duration (min): 8 min
Exercise duration (sec): 31 s
LV dias vol: 93 mL (ref 62–150)
LV sys vol: 41 mL
MPHR: 145 {beats}/min
Peak HR: 126 {beats}/min
Percent HR: 86 %
Rest HR: 51 {beats}/min
SDS: 5
SRS: 2
SSS: 7
TID: 0.7

## 2020-03-21 MED ORDER — TECHNETIUM TC 99M TETROFOSMIN IV KIT
27.2000 | PACK | Freq: Once | INTRAVENOUS | Status: AC | PRN
  Administered 2020-03-21: 27.2 via INTRAVENOUS
  Filled 2020-03-21: qty 28

## 2020-03-21 MED ORDER — TECHNETIUM TC 99M TETROFOSMIN IV KIT
10.3000 | PACK | Freq: Once | INTRAVENOUS | Status: AC | PRN
  Administered 2020-03-21: 10.3 via INTRAVENOUS
  Filled 2020-03-21: qty 11

## 2020-03-27 ENCOUNTER — Telehealth: Payer: Self-pay | Admitting: Internal Medicine

## 2020-03-27 NOTE — Telephone Encounter (Signed)
Spoke with pt, aware of nuclear test results.

## 2020-03-27 NOTE — Telephone Encounter (Signed)
Patient returning phone call to Dr. Blanchie Dessert nurse. Please call back

## 2020-04-03 ENCOUNTER — Telehealth: Payer: Self-pay | Admitting: Cardiology

## 2020-04-03 NOTE — Telephone Encounter (Signed)
Patient returning Hayley's call in regards to heart monitor results.

## 2020-04-03 NOTE — Telephone Encounter (Signed)
Spoke with patient regarding results and recommendation.  Patient verbalizes understanding and is agreeable to plan of care. Advised patient to call back with any issues or concerns.  

## 2020-04-19 ENCOUNTER — Other Ambulatory Visit: Payer: Self-pay | Admitting: Internal Medicine

## 2020-04-20 ENCOUNTER — Ambulatory Visit: Payer: PPO | Admitting: Internal Medicine

## 2020-04-25 ENCOUNTER — Encounter: Payer: Self-pay | Admitting: Internal Medicine

## 2020-04-25 ENCOUNTER — Ambulatory Visit: Payer: PPO | Admitting: Internal Medicine

## 2020-04-25 ENCOUNTER — Other Ambulatory Visit: Payer: Self-pay

## 2020-04-25 VITALS — BP 112/72 | HR 55 | Ht 70.0 in | Wt 188.0 lb

## 2020-04-25 DIAGNOSIS — Z951 Presence of aortocoronary bypass graft: Secondary | ICD-10-CM

## 2020-04-25 DIAGNOSIS — Z87898 Personal history of other specified conditions: Secondary | ICD-10-CM | POA: Diagnosis not present

## 2020-04-25 DIAGNOSIS — I2510 Atherosclerotic heart disease of native coronary artery without angina pectoris: Secondary | ICD-10-CM | POA: Diagnosis not present

## 2020-04-25 DIAGNOSIS — R001 Bradycardia, unspecified: Secondary | ICD-10-CM

## 2020-04-25 NOTE — Progress Notes (Signed)
OFFICE NOTE  Chief Complaint:  Follow-up studies  Primary Care Physician: Lonie Peak, PA-C  HPI:  [name] is a 74 year old gentleman with a history of non-ST-elevation MI in August of 2010. He had multi-vessel coronary disease and bypass by Dr. Laneta Simmers. Subsequent to that 1 month later he had chest pain and was found to have a clot in the saphenous vein graft to a diagonal which ultimately infarcted and was not amenable to opening. At the time of his initial presentation EF was 35-40%, subsequently was 45%. Since then he has had no significant heart failure symptoms, no chest pain, weight gain, worsening shortness of breath, palpitations, presyncope, syncopal symptoms. He recently had a problem with a herniated disc in his back which he managed conservatively. Surgery was contraindicated. He denies any chest pain or worsening shortness of breath. We repeated his echocardiogram in December 2013 which showed EF improved to 50-55%. There was moderate left atrial enlargement.    [name] returns today for follow-up. He is feeling quite well. He's been active and has been doing deer hunting. He gets a good amount of exercise every day without any chest pain or worsening shortness of breath. He brought laboratory work from the [place] in [place] which shows a total cholesterol 146, triglycerides 52, HDL 69 and LDL 67. He's had no ischemia evaluation since his bypass in 2010.  [name] returns today for follow-up. Overall he is doing really well. The only interim finding over the summer was that he had a syncopal event. 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 He apparently was not drinking much water and passed out. He was seen by his primary care provider felt to be dehydrated. He has not had any recurrence. Overall he denies any chest pain or worsening shortness of breath. He had a nuclear stress test last year which was low risk and showed normal LV function. It is now  been 6 years since his bypass. He just had cholesterol testing done at the [place]. His total course was 144, triglycerides 86, HDL 75, LDL 52. Blood pressure is at goal today. EKG is stable with sinus rhythm and prior inferior infarct changes.  06/27/2016  [name] was seen today in follow-up. Over the past year he denies any new medical problems. He denies any chest pain or worsening shortness of breath. He was seen at the [facility] and brought his blood work from November. His total cholesterol is now 139, triglycerides 66, HL 60 and LDLs-C 66. Rest of his blood work is unremarkable. He was concerned a little about some fatigue that he has in the mid afternoon. He gets a little bit of sleepiness. He does say that he sleeps well at night and has good energy level the morning. He denies any snoring or apneic symptoms. He was concerned about low heart rate which was 57 today and not unusual on low-dose beta blocker. Interestingly he's only taking metoprolol tartrate once daily at night. He would probably benefit from a longer acting metoprolol succinate.  06/27/2017  [name] was seen today in follow-up. He reports the past year has been uneventful. He denies CP, dyspnea or associated symptoms. BP is controlled. He brought labs from the [place] which I reviewed. His TC is 144, TG 95, HDL 53 and LDL 72, A1C is 5.8, creatinine 1.3 (normal) and PSA normal.   07/06/2018  [name] is seen today in annual follow-up.  He continues  to be asymptomatic.  He says that he is active and that he still does some hunting although recently has not had much success with it.  He generally baits and watches deer in his backyard.  He has no chest pain, shortness of breath or other associated symptoms.  Is been 5 years since his last stress test however he declined additional asymptomatic testing at this time.  Labs were obtained from the [facility] showed total cholesterol 139 and LDL of 68.  Otherwise labs were generally  within normal limits.  07/12/2019  [name] is seen today for follow-up.  This is an annual visit.  Over the summer he underwent nuclear stress testing now 5 years out from bypass.  This was negative for ischemia and showed normal LV function.  He denies any new chest pain.  Continues to be seen at the [facility].  Cholesterol has been well controlled.  His most recent lipid showed total cholesterol of 159 and LDL was 77 as of June/2020.  02/29/2020  [name] is seen today in follow-up.  He was recently hospitalized at [facility] for dizziness and was found to have marked sinus bradycardia with heart rate of 39 on EKG.  Work-up was otherwise unremarkable and he was seen in consultation by Dr. Bing Matter.  He had recommended holding the beta-blocker which was metoprolol 25 mg daily and undergoing outpatient work-up including a ZIO monitor.  He was subsequently set up for a 14-day ZIO monitor.  He returns today for evaluation.  He does have a history of coronary disease with CABG and prior stress testing that was negative for ischemia in 2015.  His grafts are quite old at this point.  He has recently had some shortness of breath and associated dizziness but no chest pain or worsening fatigue with exertion.  He says he was able to chop wood and do other activities without any issues.  EKG today shows sinus rhythm however there are some new T wave flattening in the anterolateral leads as well as borderline ST depression in V2 and V3 which were not previously noted.  04/25/2020  [name] returns today for follow-up.  He says he feels remarkably better.  He had a ZIO monitor which showed only one episode of Mobitz 1.  He did undergo a nuclear stress test which showed normal systolic function and no ischemia.  After discontinuing beta-blocker he is noted a marked improvement of his symptoms.  Blood pressure today was 112/72 heart rate now at 55.  He says in general he is now back to normal.  PMHx:    Past Medical History:  Diagnosis Date  . CAD (coronary artery disease)   . Hyperlipidemia   . Hypertension   . NSTEMI (non-ST elevated myocardial infarction) (HCC) 02/02/2009   CABGx3  . S/P CABG (coronary artery bypass graft)     Past Surgical History:  Procedure Laterality Date  . CARDIAC CATHETERIZATION  02/02/2009   LAD subtotaled in midportion, 90% narrowing distal to subtotaled areas; first diagonal with 80-90% narrowing in prox segment; Cfx with prox & mid segments with ireegularities, distal portion 80% narrowing & 90& area distally at takeoff of 4th OM, 60% narrowing at takeoff of PDA (Dr. Langston Reusing)  . CARDIAC CATHETERIZATION  02/17/2009   occluded SVG to small diagonal (Dr. Mervyn Skeeters. [place])   . CORONARY ARTERY BYPASS GRAFT  02/02/2009   x3; LIMA to LAD, SVG to PDA, SVG to 1st diagonal (Dr. Wayland Salinas)   .  TRANSTHORACIC ECHOCARDIOGRAM  05/2012   EF 50-55%, grade 1 diastolic dysfunction; MV with systolic "bowing"; LA mod dilated    FAMHx:  Family History  Problem Relation Age of Onset  . Heart disease Mother        pacemaker    SOCHx:   reports that he has never smoked. He has never used smokeless tobacco. He reports current alcohol use of about 12.0 standard drinks of alcohol per week. He reports that he does not use drugs.  ALLERGIES:  No Known Allergies  ROS: Pertinent items noted in HPI and remainder of comprehensive ROS otherwise negative.  HOME MEDS: Current Outpatient Medications  Medication Sig Dispense Refill  . aspirin 81 MG tablet Take 81 mg by mouth daily.    Marland Kitchen gabapentin (NEURONTIN) 300 MG capsule Take 1 capsule by mouth 2 (two) times daily.    Marland Kitchen lisinopril (ZESTRIL) 20 MG tablet TAKE 1 TABLET(20 MG) BY MOUTH DAILY 90 tablet 2  . nitroGLYCERIN (NITROSTAT) 0.4 MG SL tablet Place 1 tablet (0.4 mg total) under the tongue every 5 (five) minutes x 3 doses as needed for chest pain (up to 3 doses, if no relief after third dose, proceed to the ED for an evaluation). 25  tablet 3  . simvastatin (ZOCOR) 20 MG tablet Take 1 tablet (20 mg total) by mouth daily. 90 tablet 3  . zolpidem (AMBIEN) 10 MG tablet Take 10 mg by mouth at bedtime as needed for sleep.     No current facility-administered medications for this visit.    LABS/IMAGING: No results found for this or any previous visit (from the past 48 hour(s)). No results found.  VITALS: BP 112/72   Pulse (!) 55   Ht 5\' 10"  (1.778 m)   Wt 188 lb (85.3 kg)   SpO2 98%   BMI 26.98 kg/m   EXAM: General appearance: alert and no distress Neck: no carotid bruit and no JVD Lungs: clear to auscultation bilaterally Heart: regular rate and rhythm, S1, S2 normal, no murmur, click, rub or gallop Abdomen: soft, non-tender; bowel sounds normal; no masses,  no organomegaly Extremities: extremities normal, atraumatic, no cyanosis or edema Pulses: 2+ and symmetric Skin: Skin color, texture, turgor normal. No rashes or lesions Neurologic: Grossly normal Psych: Mood, affect normal, pleasant  EKG: Deferred  ASSESSMENT: 1. Symptomatic sinus bradycardia -proved off beta-blockers 2. Coronary artery disease status post stenting in 2010 - low risk Myoview in 03/2020 (EF 56%) 3. Three-vessel CABG with LIMA to LAD, SVG to PDA, and SVG to diagonal (occluded) 4. Hypertension-well controlled 5. Dyslipidemia  PLAN: 1.   Mr. Lambertson seems to be feeling much better now off beta-blockers.  Heart rate is in the 50s and blood pressure is well controlled.  Repeat Myoview shows normal systolic function with no ischemia.  Blood pressures well controlled.  No changes to his medicines at this point.  Plan follow-up with me annually or sooner as necessary.  Shea Evans, MD, [facility], FACP  [place]  [facility]  Medical Director of the [facility] Lipid Disorders &  Cardiovascular Risk Reduction Clinic Diplomate of the American Board of Clinical Lipidology Attending Cardiologist  Direct Dial: [phone]  Fax: [phone]   Website:  [email or web address] Sawyer Kahan 04/25/2020, 10:15 AM

## 2020-04-25 NOTE — Patient Instructions (Signed)

## 2020-05-31 DIAGNOSIS — Z1211 Encounter for screening for malignant neoplasm of colon: Secondary | ICD-10-CM | POA: Diagnosis not present

## 2020-05-31 DIAGNOSIS — M25562 Pain in left knee: Secondary | ICD-10-CM | POA: Diagnosis not present

## 2020-05-31 DIAGNOSIS — G8929 Other chronic pain: Secondary | ICD-10-CM | POA: Diagnosis not present

## 2020-05-31 DIAGNOSIS — I10 Essential (primary) hypertension: Secondary | ICD-10-CM | POA: Diagnosis not present

## 2020-05-31 DIAGNOSIS — M5416 Radiculopathy, lumbar region: Secondary | ICD-10-CM | POA: Diagnosis not present

## 2020-05-31 DIAGNOSIS — G4700 Insomnia, unspecified: Secondary | ICD-10-CM | POA: Diagnosis not present

## 2020-05-31 DIAGNOSIS — I2510 Atherosclerotic heart disease of native coronary artery without angina pectoris: Secondary | ICD-10-CM | POA: Diagnosis not present

## 2020-05-31 DIAGNOSIS — E7800 Pure hypercholesterolemia, unspecified: Secondary | ICD-10-CM | POA: Diagnosis not present

## 2020-05-31 DIAGNOSIS — Z6827 Body mass index (BMI) 27.0-27.9, adult: Secondary | ICD-10-CM | POA: Diagnosis not present

## 2020-06-29 DIAGNOSIS — Z1212 Encounter for screening for malignant neoplasm of rectum: Secondary | ICD-10-CM | POA: Diagnosis not present

## 2020-06-29 DIAGNOSIS — Z1211 Encounter for screening for malignant neoplasm of colon: Secondary | ICD-10-CM | POA: Diagnosis not present

## 2020-10-02 ENCOUNTER — Other Ambulatory Visit: Payer: Self-pay | Admitting: Internal Medicine

## 2020-10-02 NOTE — Telephone Encounter (Signed)
Rx has been sent to the pharmacy electronically. ° °

## 2020-11-30 DIAGNOSIS — I2510 Atherosclerotic heart disease of native coronary artery without angina pectoris: Secondary | ICD-10-CM | POA: Diagnosis not present

## 2020-11-30 DIAGNOSIS — Z6825 Body mass index (BMI) 25.0-25.9, adult: Secondary | ICD-10-CM | POA: Diagnosis not present

## 2020-11-30 DIAGNOSIS — Z125 Encounter for screening for malignant neoplasm of prostate: Secondary | ICD-10-CM | POA: Diagnosis not present

## 2020-11-30 DIAGNOSIS — G4700 Insomnia, unspecified: Secondary | ICD-10-CM | POA: Diagnosis not present

## 2020-11-30 DIAGNOSIS — I10 Essential (primary) hypertension: Secondary | ICD-10-CM | POA: Diagnosis not present

## 2020-11-30 DIAGNOSIS — Z1331 Encounter for screening for depression: Secondary | ICD-10-CM | POA: Diagnosis not present

## 2020-11-30 DIAGNOSIS — Z9181 History of falling: Secondary | ICD-10-CM | POA: Diagnosis not present

## 2020-11-30 DIAGNOSIS — E7800 Pure hypercholesterolemia, unspecified: Secondary | ICD-10-CM | POA: Diagnosis not present

## 2020-11-30 DIAGNOSIS — M25562 Pain in left knee: Secondary | ICD-10-CM | POA: Diagnosis not present

## 2020-11-30 DIAGNOSIS — G8929 Other chronic pain: Secondary | ICD-10-CM | POA: Diagnosis not present

## 2020-11-30 DIAGNOSIS — Z139 Encounter for screening, unspecified: Secondary | ICD-10-CM | POA: Diagnosis not present

## 2020-11-30 DIAGNOSIS — M5416 Radiculopathy, lumbar region: Secondary | ICD-10-CM | POA: Diagnosis not present

## 2020-12-26 DIAGNOSIS — I158 Other secondary hypertension: Secondary | ICD-10-CM | POA: Diagnosis not present

## 2020-12-26 DIAGNOSIS — M545 Low back pain, unspecified: Secondary | ICD-10-CM | POA: Diagnosis not present

## 2020-12-26 DIAGNOSIS — Z79899 Other long term (current) drug therapy: Secondary | ICD-10-CM | POA: Diagnosis not present

## 2020-12-26 DIAGNOSIS — Z7982 Long term (current) use of aspirin: Secondary | ICD-10-CM | POA: Diagnosis not present

## 2020-12-26 DIAGNOSIS — I10 Essential (primary) hypertension: Secondary | ICD-10-CM | POA: Diagnosis not present

## 2020-12-26 DIAGNOSIS — Z7952 Long term (current) use of systemic steroids: Secondary | ICD-10-CM | POA: Diagnosis not present

## 2020-12-26 DIAGNOSIS — G8929 Other chronic pain: Secondary | ICD-10-CM | POA: Diagnosis not present

## 2020-12-27 DIAGNOSIS — M5416 Radiculopathy, lumbar region: Secondary | ICD-10-CM | POA: Diagnosis not present

## 2020-12-27 DIAGNOSIS — G8929 Other chronic pain: Secondary | ICD-10-CM | POA: Diagnosis not present

## 2020-12-27 DIAGNOSIS — I10 Essential (primary) hypertension: Secondary | ICD-10-CM | POA: Diagnosis not present

## 2020-12-27 DIAGNOSIS — Z6825 Body mass index (BMI) 25.0-25.9, adult: Secondary | ICD-10-CM | POA: Diagnosis not present

## 2021-01-11 DIAGNOSIS — J309 Allergic rhinitis, unspecified: Secondary | ICD-10-CM | POA: Diagnosis not present

## 2021-01-11 DIAGNOSIS — Z6825 Body mass index (BMI) 25.0-25.9, adult: Secondary | ICD-10-CM | POA: Diagnosis not present

## 2021-01-11 DIAGNOSIS — Z20822 Contact with and (suspected) exposure to covid-19: Secondary | ICD-10-CM | POA: Diagnosis not present

## 2021-03-16 ENCOUNTER — Other Ambulatory Visit: Payer: Self-pay | Admitting: Internal Medicine

## 2021-05-31 DIAGNOSIS — I10 Essential (primary) hypertension: Secondary | ICD-10-CM | POA: Diagnosis not present

## 2021-05-31 DIAGNOSIS — I2510 Atherosclerotic heart disease of native coronary artery without angina pectoris: Secondary | ICD-10-CM | POA: Diagnosis not present

## 2021-05-31 DIAGNOSIS — E7800 Pure hypercholesterolemia, unspecified: Secondary | ICD-10-CM | POA: Diagnosis not present

## 2021-05-31 DIAGNOSIS — Z23 Encounter for immunization: Secondary | ICD-10-CM | POA: Diagnosis not present

## 2021-05-31 DIAGNOSIS — M25562 Pain in left knee: Secondary | ICD-10-CM | POA: Diagnosis not present

## 2021-05-31 DIAGNOSIS — M5416 Radiculopathy, lumbar region: Secondary | ICD-10-CM | POA: Diagnosis not present

## 2021-05-31 DIAGNOSIS — Z6824 Body mass index (BMI) 24.0-24.9, adult: Secondary | ICD-10-CM | POA: Diagnosis not present

## 2021-05-31 DIAGNOSIS — G4700 Insomnia, unspecified: Secondary | ICD-10-CM | POA: Diagnosis not present

## 2021-05-31 DIAGNOSIS — G8929 Other chronic pain: Secondary | ICD-10-CM | POA: Diagnosis not present

## 2021-06-14 ENCOUNTER — Other Ambulatory Visit: Payer: Self-pay | Admitting: Internal Medicine

## 2021-07-10 DIAGNOSIS — R69 Illness, unspecified: Secondary | ICD-10-CM | POA: Diagnosis not present

## 2021-07-16 ENCOUNTER — Other Ambulatory Visit: Payer: Self-pay

## 2021-07-16 MED ORDER — SIMVASTATIN 20 MG PO TABS: 20.0000 mg | ORAL_TABLET | Freq: Every day | ORAL | 0 refills | Status: DC

## 2021-07-16 MED ORDER — LISINOPRIL 20 MG PO TABS: ORAL_TABLET | ORAL | 0 refills | Status: DC

## 2021-07-18 ENCOUNTER — Other Ambulatory Visit: Payer: Self-pay | Admitting: Internal Medicine

## 2021-07-18 MED ORDER — SIMVASTATIN 20 MG PO TABS: 20.0000 mg | ORAL_TABLET | Freq: Every day | ORAL | 0 refills | Status: DC

## 2021-07-18 MED ORDER — LISINOPRIL 20 MG PO TABS: ORAL_TABLET | ORAL | 0 refills | Status: DC

## 2021-08-23 ENCOUNTER — Other Ambulatory Visit: Payer: Self-pay | Admitting: Internal Medicine

## 2021-10-01 ENCOUNTER — Other Ambulatory Visit: Payer: Self-pay | Admitting: Internal Medicine

## 2021-10-08 ENCOUNTER — Telehealth: Payer: Self-pay | Admitting: Internal Medicine

## 2021-10-08 ENCOUNTER — Other Ambulatory Visit: Payer: Self-pay | Admitting: Internal Medicine

## 2021-10-08 NOTE — Telephone Encounter (Signed)
?*  STAT* If patient is at the pharmacy, call can be transferred to refill team. ? ? ?1. Which medications need to be refilled? (please list name of each medication and dose if known)  ?lisinopril (ZESTRIL) 20 MG tablet ?simvastatin (ZOCOR) 20 MG tablet ? ?2. Which pharmacy/location (including street and city if local pharmacy) is medication to be sent to? ?[place], [place] ? ?3. Do they need a 30 day or 90 day supply?  ?90 day supply ? ?

## 2021-10-09 NOTE — Progress Notes (Deleted)
Cardiology Office Note:    Date:  10/09/2021   ID:  JAMICHAEL KNOTTS, DOB [birth year], MRN [id number]  PCP:  Lonie Peak, PA-C   [facility] Providers Cardiologist:  Chrystie Nose, MD { Click to update primary MD,subspecialty MD or APP then REFRESH:1}    Referring MD: Lonie Peak, PA-C   Follow-up for hypertension and coronary artery disease status post CABG x3 on 12/14  History of Present Illness:    [name] is a 76 y.o. male with a hx of coronary artery disease status post CABG x3 on 12/14, hyperlipidemia, and essential hypertension.  He was seen by Dr. Rennis Golden on 04/25/2020.  During that time he was feeling much better.  His cardiac event monitor showed 1 episode of Mobitz type I.  His stress testing showed normal systolic function and no ischemia.  His beta-blocker therapy was previously discontinued which showed improvement in his symptoms.  His blood pressure during evaluation was 112/72 with a heart rate of 55.  He presents to the clinic today for follow-up evaluation and states***  *** denies chest pain, shortness of breath, lower extremity edema, fatigue, palpitations, melena, hematuria, hemoptysis, diaphoresis, weakness, presyncope, syncope, orthopnea, and PND.   Past Medical History:  Diagnosis Date   CAD (coronary artery disease)    Hyperlipidemia    Hypertension    NSTEMI (non-ST elevated myocardial infarction) (HCC) 02/02/2009   CABGx3   S/P CABG (coronary artery bypass graft)     Past Surgical History:  Procedure Laterality Date   CARDIAC CATHETERIZATION  02/02/2009   LAD subtotaled in midportion, 90% narrowing distal to subtotaled areas; first diagonal with 80-90% narrowing in prox segment; Cfx with prox & mid segments with ireegularities, distal portion 80% narrowing & 90& area distally at takeoff of 4th OM, 60% narrowing at takeoff of PDA (Dr. Langston Reusing)   CARDIAC CATHETERIZATION  02/17/2009   occluded SVG to small diagonal (Dr. Langston Reusing)    CORONARY  ARTERY BYPASS GRAFT  02/02/2009   x3; LIMA to LAD, SVG to PDA, SVG to 1st diagonal (Dr. Wayland Salinas)    TRANSTHORACIC ECHOCARDIOGRAM  05/2012   EF 50-55%, grade 1 diastolic dysfunction; MV with systolic "bowing"; LA mod dilated    Current Medications: No outpatient medications have been marked as taking for the 10/10/21 encounter (Appointment) with Ronney Asters, NP.     Allergies:   Patient has no known allergies.   Social History   Socioeconomic History   Marital status: Married    Spouse name: Not on file   Number of children: 2   Years of education: Not on file   Highest education level: Not on file  Occupational History   Not on file  Tobacco Use   Smoking status: Never   Smokeless tobacco: Never  Substance and Sexual Activity   Alcohol use: Yes    Alcohol/week: 12.0 standard drinks    Types: 12 drink(s) per week   Drug use: No   Sexual activity: Not on file  Other Topics Concern   Not on file  Social History Narrative   Not on file   Social Determinants of Health   Financial Resource Strain: Not on file  Food Insecurity: Not on file  Transportation Needs: Not on file  Physical Activity: Not on file  Stress: Not on file  Social Connections: Not on file     Family History: The patient's ***family history includes Heart disease in his mother.  ROS:   Please  see the history of present illness.    *** All other systems reviewed and are negative.   Risk Assessment/Calculations:   {Does this patient have ATRIAL FIBRILLATION?[phone]}       Physical Exam:    VS:  There were no vitals taken for this visit.    Wt Readings from Last 3 Encounters:  04/25/20 188 lb (85.3 kg)  03/21/20 187 lb (84.8 kg)  02/29/20 187 lb 6.4 oz (85 kg)     GEN: *** Well nourished, well developed in no acute distress HEENT: Normal NECK: No JVD; No carotid bruits LYMPHATICS: No lymphadenopathy CARDIAC: ***RRR, no murmurs, rubs, gallops RESPIRATORY:  Clear to  auscultation without rales, wheezing or rhonchi  ABDOMEN: Soft, non-tender, non-distended MUSCULOSKELETAL:  No edema; No deformity  SKIN: Warm and dry NEUROLOGIC:  Alert and oriented x 3 PSYCHIATRIC:  Normal affect    EKGs/Labs/Other Studies Reviewed:    The following studies were reviewed today:  Echocardiogram 06/04/2012 Study Conclusions   - Left ventricle: The cavity size was normal. Wall thickness    was normal. Systolic function was at least low normal. The    estimated ejection fraction was in the range of 50% to    55%. Wall motion was normal; there were no regional wall    motion abnormalities. Doppler parameters are consistent    with abnormal left ventricular relaxation (grade 1    diastolic dysfunction).  - Ventricular septum: Septal motion showed "bounce". These    changes are consistent with a post-sternotomy state.  - Mitral valve: Systolic bowing without prolapse.  - Left atrium: The atrium was moderately dilated.  - Atrial septum: The septum was thickened.  Impressions:   - Compared to the previous study, these findings represent    improvement.  EKG:  EKG is *** ordered today.  The ekg ordered today demonstrates ***  Recent Labs: No results found for requested labs within last 8760 hours.  Recent Lipid Panel    Component Value Date/Time   CHOL  02/04/2009 0415    79        ATP III CLASSIFICATION:  <200     mg/dL   Desirable  [id number]  mg/dL   Borderline High  >=045>=240    mg/dL   High          TRIG 28 02/04/2009 0415   HDL 32 (L) 02/04/2009 0415   CHOLHDL 2.5 02/04/2009 0415   VLDL 6 02/04/2009 0415   LDLCALC  02/04/2009 0415    41        Total Cholesterol/HDL:CHD Risk Coronary Heart Disease Risk Table                     Men   Women  1/2 Average Risk   3.4   3.3  Average Risk       5.0   4.4  2 X Average Risk   9.6   7.1  3 X Average Risk  23.4   11.0        Use the calculated Patient Ratio above and the CHD Risk Table to determine the  patient's CHD Risk.        ATP III CLASSIFICATION (LDL):  <100     mg/dL   Optimal  [id number]  mg/dL   Near or Above                    Optimal  130-159  mg/dL   Borderline  [id number]  mg/dL  High  >190     mg/dL   Very High    ASSESSMENT & PLAN    Coronary artery disease-denies recent episodes of arm neck back or chest discomfort.  Underwent CABG x3 in 2014 (LIMA-LAD, SVG-diagonal, SVG-PDA) Continue aspirin, lisinopril, simvastatin, nitroglycerin Heart healthy low-sodium diet-salty 6 given Increase physical activity as tolerated  Symptomatic bradycardia-EKG today shows***.  Denies dizziness, presyncope or syncope.  Energy/endurance level improved. Continue to monitor Heart healthy low-sodium diet-salty 6 given Increase physical activity as tolerated  Hyperlipidemia-LDL*** Continue aspirin, simvastatin Heart healthy low-sodium high-fiber diet Increase physical activity as tolerated   Essential hypertension--BP today***.  Well-controlled at home. Continue lisinopril Heart healthy low-sodium diet-salty 6 given Increase physical activity as tolerated  Disposition: Follow-up with Dr. Rennis Golden or me in 12 months.  {Are you ordering a CV Procedure (e.g. stress test, cath, DCCV, TEE, etc)?   Press F2        :[id number]}    Medication Adjustments/Labs and Tests Ordered: Current medicines are reviewed at length with the patient today.  Concerns regarding medicines are outlined above.  No orders of the defined types were placed in this encounter.  No orders of the defined types were placed in this encounter.   There are no Patient Instructions on file for this visit.   Signed, Ronney Asters, NP  10/09/2021 10:41 AM      Notice: This dictation was prepared with Dragon dictation along with smaller phrase technology. Any transcriptional errors that result from this process are unintentional and may not be corrected upon review.  I spent***minutes examining this patient, reviewing  medications, and using patient centered shared decision making involving her cardiac care.  Prior to her visit I spent greater than 20 minutes reviewing her past medical history,  medications, and prior cardiac tests.

## 2021-10-10 ENCOUNTER — Ambulatory Visit: Payer: Medicare HMO | Admitting: Physician Assistant

## 2021-10-10 ENCOUNTER — Ambulatory Visit: Payer: PPO | Admitting: Physician Assistant

## 2021-10-10 ENCOUNTER — Encounter: Payer: Self-pay | Admitting: Physician Assistant

## 2021-10-10 ENCOUNTER — Ambulatory Visit (HOSPITAL_BASED_OUTPATIENT_CLINIC_OR_DEPARTMENT_OTHER): Payer: PPO | Admitting: General Practice

## 2021-10-10 VITALS — BP 122/76 | HR 51 | Ht 70.0 in | Wt 166.6 lb

## 2021-10-10 DIAGNOSIS — E785 Hyperlipidemia, unspecified: Secondary | ICD-10-CM

## 2021-10-10 DIAGNOSIS — I25810 Atherosclerosis of coronary artery bypass graft(s) without angina pectoris: Secondary | ICD-10-CM

## 2021-10-10 DIAGNOSIS — R001 Bradycardia, unspecified: Secondary | ICD-10-CM | POA: Diagnosis not present

## 2021-10-10 DIAGNOSIS — I10 Essential (primary) hypertension: Secondary | ICD-10-CM

## 2021-10-10 MED ORDER — LISINOPRIL 20 MG PO TABS: 20.0000 mg | ORAL_TABLET | Freq: Every day | ORAL | 3 refills | Status: DC

## 2021-10-10 MED ORDER — SIMVASTATIN 20 MG PO TABS: 20.0000 mg | ORAL_TABLET | Freq: Every day | ORAL | 3 refills | Status: DC

## 2021-10-10 NOTE — Progress Notes (Signed)
?Cardiology Office Note:   ? ?Date:  10/12/2021  ? ?ID:  [name], DOB August 01, 1945, MRN [id number] ? ?PCP:  Todd Peak, PA-C ?  ?[facility] Providers ?Cardiologist:  Todd Nose, MD    ? ?Referring MD: Todd Peak, PA-C  ? ?Chief Complaint  ?Patient presents with  ? Follow-up  ?  Seen for Dr. Rennis Walters  ? ? ?History of Present Illness:   ? ?[name] is a 76 y.o. male with a hx of CAD s/p CABG, HTN, and HLD.  Patient presented with NSTEMI in 2010 and underwent CABG by Dr. Laneta Simmers.  A month later, he had chest pain and found to have a clot in the SVG to diagonal vessel which ultimately infarcted and not amenable to PCI.  EF was 35 to 40% on initial presentation, subsequently improved to 45%.  Repeat echocardiogram in December 2013 showed improvement of EF to 50 to 55%, moderate LVE.  Most recent Myoview obtained in October 2021 showed EF 56%, overall low risk study, mild apical thinning but no ischemia.  Patient was previously seen in [facility] for dizziness and the EKG revealed significant bradycardia with a heart rate down to the 30s.  Metoprolol was discontinued.  Subsequent 14-day ZIO monitor showed only 1 episode of Mobitz 1.  He was last seen by Dr. Rennis Walters in November 2021 at which time he was doing well. ? ?Patient presents today for follow-up.  He denies any recent exertional chest pain or worsening dyspnea.  He has no recent dizziness, blurred vision or feeling of passing out.  Heart rate is 51 bpm on today's EKG.  There was no significant EKG changes.  He has no lower extremity edema, orthopnea or PND.  Despite his advanced age, he is fairly independent.  He just recently cleaned the leaf off of his roof.  I did recommend he avoid going up to the roof with his age due to concern for fall risk.  Last lipid panel obtained in December showed total cholesterol 142, HDL 62, LDL 67, triglycerides 63. ? ? ?Past Medical History:  ?Diagnosis Date  ? CAD (coronary artery disease)   ?  Hyperlipidemia   ? Hypertension   ? NSTEMI (non-ST elevated myocardial infarction) (HCC) 02/02/2009  ? CABGx3  ? S/P CABG (coronary artery bypass graft)   ? ? ?Past Surgical History:  ?Procedure Laterality Date  ? CARDIAC CATHETERIZATION  02/02/2009  ? LAD subtotaled in midportion, 90% narrowing distal to subtotaled areas; first diagonal with 80-90% narrowing in prox segment; Cfx with prox & mid segments with ireegularities, distal portion 80% narrowing & 90& area distally at takeoff of 4th OM, 60% narrowing at takeoff of PDA (Dr. Langston Reusing)  ? CARDIAC CATHETERIZATION  02/17/2009  ? occluded SVG to small diagonal (Dr. Langston Reusing)   ? CORONARY ARTERY BYPASS GRAFT  02/02/2009  ? x3; LIMA to LAD, SVG to PDA, SVG to 1st diagonal (Dr. Wayland Salinas)   ? TRANSTHORACIC ECHOCARDIOGRAM  05/2012  ? EF 50-55%, grade 1 diastolic dysfunction; MV with systolic "bowing"; LA mod dilated  ? ? ?Current Medications: ?Current Meds  ?Medication Sig  ? acetaminophen (TYLENOL) 500 MG tablet as needed.  ? aspirin 81 MG tablet Take 81 mg by mouth daily.  ? Cholecalciferol 25 MCG (1000 UT) tablet Take 1 tablet by mouth daily.  ? diclofenac Sodium (VOLTAREN) 1 % GEL SMARTSIG:4 Gram(s) Topical 1-4 Times Daily  ? gabapentin (NEURONTIN) 300 MG capsule Take 1 capsule by mouth 2 (  two) times daily.  ? nitroGLYCERIN (NITROSTAT) 0.4 MG SL tablet Place 1 tablet (0.4 mg total) under the tongue every 5 (five) minutes x 3 doses as needed for chest pain (up to 3 doses, if no relief after third dose, proceed to the ED for an evaluation).  ? zolpidem (AMBIEN) 10 MG tablet Take 10 mg by mouth at bedtime as needed for sleep.  ? [DISCONTINUED] lisinopril (ZESTRIL) 20 MG tablet TAKE 1 TABLET BY MOUTH DAILY (NEED APPOINTMENT FOR FURTHER REFILLS)  ? [DISCONTINUED] simvastatin (ZOCOR) 20 MG tablet TAKE 1 TABLET BY MOUTH DAILY (NEED APPOINTMENT FOR FURTHER REFILLS)  ?  ? ?Allergies:   Patient has no known allergies.  ? ?Social History  ? ?Socioeconomic History  ? Marital  status: Married  ?  Spouse name: Not on file  ? Number of children: 2  ? Years of education: Not on file  ? Highest education level: Not on file  ?Occupational History  ? Not on file  ?Tobacco Use  ? Smoking status: Never  ? Smokeless tobacco: Never  ?Substance and Sexual Activity  ? Alcohol use: Yes  ?  Alcohol/week: 12.0 standard drinks  ?  Types: 12 drink(s) per week  ? Drug use: No  ? Sexual activity: Not on file  ?Other Topics Concern  ? Not on file  ?Social History Narrative  ? Not on file  ? ?Social Determinants of Health  ? ?Financial Resource Strain: Not on file  ?Food Insecurity: Not on file  ?Transportation Needs: Not on file  ?Physical Activity: Not on file  ?Stress: Not on file  ?Social Connections: Not on file  ?  ? ?Family History: ?The patient's family history includes Heart disease in his mother. ? ?ROS:   ?Please see the history of present illness.    ? All other systems reviewed and are negative. ? ?EKGs/Labs/Other Studies Reviewed:   ? ?The following studies were reviewed today: ? ?Myoview 03/21/2020 ?Nuclear stress EF: 56%. ?The left ventricular ejection fraction is normal (55-65%). ?Blood pressure demonstrated a normal response to exercise. ?There was no ST segment deviation noted during stress. ?Defect 1: There is a small defect of mild severity present in the apex location. ?This is a low risk study. ?  ?Normal stress nuclear study with mild apical thinning but no ischemia.  Gated ejection fraction 56% with normal wall motion. ?  ? ?EKG:  EKG is ordered today.  The ekg ordered today demonstrates normal sinus rhythm, no significant ST-T wave changes. ? ?Recent Labs: ?No results found for requested labs within last 8760 hours.  ?Recent Lipid Panel ?   ?Component Value Date/Time  ? CHOL  02/04/2009 0415  ?  79        ?ATP III CLASSIFICATION: ? <200     mg/dL   Desirable ? [id number]  mg/dL   Borderline High ? >=914>=240    mg/dL   High ?        ? TRIG 28 02/04/2009 0415  ? HDL 32 (L) 02/04/2009 0415  ?  CHOLHDL 2.5 02/04/2009 0415  ? VLDL 6 02/04/2009 0415  ? LDLCALC  02/04/2009 0415  ?  41        ?Total Cholesterol/HDL:CHD Risk ?Coronary Heart Disease Risk Table ?                    Men   Women ? 1/2 Average Risk   3.4   3.3 ? Average Risk  5.0   4.4 ? 2 X Average Risk   9.6   7.1 ? 3 X Average Risk  23.4   11.0 ?       ?Use the calculated Patient Ratio ?above and the CHD Risk Table ?to determine the patient's CHD Risk. ?       ?ATP III CLASSIFICATION (LDL): ? <100     mg/dL   Optimal ? 762-831  mg/dL   Near or Above ?                   Optimal ? 130-159  mg/dL   Borderline ? 160-189  mg/dL   High ? >517     mg/dL   Very High  ? ? ? ?Risk Assessment/Calculations:   ?  ? ?    ? ?Physical Exam:   ? ?VS:  BP 122/76 (BP Location: Left Arm, Patient Position: Sitting, Cuff Size: Normal)   Pulse (!) 51   Ht 5\' 10"  (1.778 m)   Wt 166 lb 9.6 oz (75.6 kg)   SpO2 98%   BMI 23.90 kg/m?    ? ?Wt Readings from Last 3 Encounters:  ?10/10/21 166 lb 9.6 oz (75.6 kg)  ?04/25/20 188 lb (85.3 kg)  ?03/21/20 187 lb (84.8 kg)  ?  ? ?GEN:  Well nourished, well developed in no acute distress ?HEENT: Normal ?NECK: No JVD; No carotid bruits ?LYMPHATICS: No lymphadenopathy ?CARDIAC: RRR, no murmurs, rubs, gallops ?RESPIRATORY:  Clear to auscultation without rales, wheezing or rhonchi  ?ABDOMEN: Soft, non-tender, non-distended ?MUSCULOSKELETAL:  No edema; No deformity  ?SKIN: Warm and dry ?NEUROLOGIC:  Alert and oriented x 3 ?PSYCHIATRIC:  Normal affect  ? ?ASSESSMENT:   ? ?1. Coronary artery disease involving coronary bypass graft of native heart without angina pectoris   ?2. Essential hypertension   ?3. Hyperlipidemia LDL goal <70   ?4. Symptomatic sinus bradycardia   ? ?PLAN:   ? ?In order of problems listed above: ? ?CAD s/p CABG: Denies any recent chest discomfort.  Continue aspirin and simvastatin.  Not on beta-blocker due to symptomatic bradycardia. ? ?Hypertension: Blood pressure well controlled on current  therapy ? ?Hyperlipidemia: Continue simvastatin ? ?History of symptomatic bradycardia: Resolved after discontinuation of beta-blocker. ? ?   ? ?   ? ? ?Medication Adjustments/Labs and Tests Ordered: ?Current medicines are reviewed at lengt

## 2021-10-10 NOTE — Patient Instructions (Signed)
Medication Instructions:  ?Your physician recommends that you continue on your current medications as directed. Please refer to the Current Medication list given to you today.  ? ?*If you need a refill on your cardiac medications before your next appointment, please call your pharmacy* ? ? ?Lab Work: ?NONE ordered at this time of appointment  ? ?If you have labs (blood work) drawn today and your tests are completely normal, you will receive your results only by: ?MyChart Message (if you have MyChart) OR ?A paper copy in the mail ?If you have any lab test that is abnormal or we need to change your treatment, we will call you to review the results. ? ? ?Testing/Procedures: ?NONE ordered at this time of appointment  ? ? ? ?Follow-Up: ?At [facility], you and your health needs are our priority.  As part of our continuing mission to provide you with exceptional heart care, we have created designated Provider Care Teams.  These Care Teams include your primary Cardiologist (physician) and Advanced Practice Providers (APPs -  Physician Assistants and Nurse Practitioners) who all work together to provide you with the care you need, when you need it. ? ?We recommend signing up for the patient portal called "MyChart".  Sign up information is provided on this After Visit Summary.  MyChart is used to connect with patients for Virtual Visits (Telemedicine).  Patients are able to view lab/test results, encounter notes, upcoming appointments, etc.  Non-urgent messages can be sent to your provider as well.   ?To learn more about what you can do with MyChart, go to [email or web address].   ? ?Your next appointment:   ?1 year(s) ? ?The format for your next appointment:   ?In Person ? ?Provider:   ?Kenneth C Hilty, MD   ? ? ?Other Instructions ? ? ?Important Information About Sugar ? ? ? ? ? ? ?

## 2021-10-10 NOTE — Telephone Encounter (Signed)
Medications were refilled at today visit and sent to desired pharmacy.  ?

## 2021-10-12 ENCOUNTER — Encounter: Payer: Self-pay | Admitting: Physician Assistant

## 2021-12-11 DIAGNOSIS — G8929 Other chronic pain: Secondary | ICD-10-CM | POA: Diagnosis not present

## 2021-12-11 DIAGNOSIS — I10 Essential (primary) hypertension: Secondary | ICD-10-CM | POA: Diagnosis not present

## 2021-12-11 DIAGNOSIS — E7800 Pure hypercholesterolemia, unspecified: Secondary | ICD-10-CM | POA: Diagnosis not present

## 2021-12-11 DIAGNOSIS — Z1331 Encounter for screening for depression: Secondary | ICD-10-CM | POA: Diagnosis not present

## 2021-12-11 DIAGNOSIS — G4700 Insomnia, unspecified: Secondary | ICD-10-CM | POA: Diagnosis not present

## 2021-12-11 DIAGNOSIS — I2510 Atherosclerotic heart disease of native coronary artery without angina pectoris: Secondary | ICD-10-CM | POA: Diagnosis not present

## 2021-12-11 DIAGNOSIS — Z125 Encounter for screening for malignant neoplasm of prostate: Secondary | ICD-10-CM | POA: Diagnosis not present

## 2021-12-11 DIAGNOSIS — M5416 Radiculopathy, lumbar region: Secondary | ICD-10-CM | POA: Diagnosis not present

## 2021-12-11 DIAGNOSIS — Z6825 Body mass index (BMI) 25.0-25.9, adult: Secondary | ICD-10-CM | POA: Diagnosis not present

## 2022-03-19 DIAGNOSIS — Z1331 Encounter for screening for depression: Secondary | ICD-10-CM | POA: Diagnosis not present

## 2022-03-19 DIAGNOSIS — Z0000 Encounter for general adult medical examination without abnormal findings: Secondary | ICD-10-CM | POA: Diagnosis not present

## 2022-03-19 DIAGNOSIS — Z9181 History of falling: Secondary | ICD-10-CM | POA: Diagnosis not present

## 2022-03-19 DIAGNOSIS — Z139 Encounter for screening, unspecified: Secondary | ICD-10-CM | POA: Diagnosis not present

## 2022-05-19 ENCOUNTER — Other Ambulatory Visit: Payer: Self-pay | Admitting: Internal Medicine

## 2022-06-09 ENCOUNTER — Other Ambulatory Visit: Payer: Self-pay | Admitting: Internal Medicine

## 2022-07-02 DIAGNOSIS — G4700 Insomnia, unspecified: Secondary | ICD-10-CM | POA: Diagnosis not present

## 2022-07-02 DIAGNOSIS — I10 Essential (primary) hypertension: Secondary | ICD-10-CM | POA: Diagnosis not present

## 2022-07-02 DIAGNOSIS — M5416 Radiculopathy, lumbar region: Secondary | ICD-10-CM | POA: Diagnosis not present

## 2022-07-02 DIAGNOSIS — I2510 Atherosclerotic heart disease of native coronary artery without angina pectoris: Secondary | ICD-10-CM | POA: Diagnosis not present

## 2022-07-02 DIAGNOSIS — G8929 Other chronic pain: Secondary | ICD-10-CM | POA: Diagnosis not present

## 2022-07-02 DIAGNOSIS — E7800 Pure hypercholesterolemia, unspecified: Secondary | ICD-10-CM | POA: Diagnosis not present

## 2022-09-07 ENCOUNTER — Other Ambulatory Visit: Payer: Self-pay | Admitting: Internal Medicine

## 2022-10-09 ENCOUNTER — Other Ambulatory Visit: Payer: Self-pay | Admitting: Internal Medicine

## 2022-10-16 ENCOUNTER — Other Ambulatory Visit: Payer: Self-pay | Admitting: Physician Assistant

## 2022-10-28 DIAGNOSIS — H524 Presbyopia: Secondary | ICD-10-CM | POA: Diagnosis not present

## 2022-10-28 DIAGNOSIS — H353130 Nonexudative age-related macular degeneration, bilateral, stage unspecified: Secondary | ICD-10-CM | POA: Diagnosis not present

## 2022-10-28 DIAGNOSIS — H26493 Other secondary cataract, bilateral: Secondary | ICD-10-CM | POA: Diagnosis not present

## 2022-10-28 DIAGNOSIS — Z961 Presence of intraocular lens: Secondary | ICD-10-CM | POA: Diagnosis not present

## 2022-12-05 ENCOUNTER — Other Ambulatory Visit: Payer: Self-pay | Admitting: Internal Medicine

## 2022-12-31 DIAGNOSIS — I2510 Atherosclerotic heart disease of native coronary artery without angina pectoris: Secondary | ICD-10-CM | POA: Diagnosis not present

## 2022-12-31 DIAGNOSIS — M5416 Radiculopathy, lumbar region: Secondary | ICD-10-CM | POA: Diagnosis not present

## 2022-12-31 DIAGNOSIS — Z125 Encounter for screening for malignant neoplasm of prostate: Secondary | ICD-10-CM | POA: Diagnosis not present

## 2022-12-31 DIAGNOSIS — E7800 Pure hypercholesterolemia, unspecified: Secondary | ICD-10-CM | POA: Diagnosis not present

## 2022-12-31 DIAGNOSIS — G8929 Other chronic pain: Secondary | ICD-10-CM | POA: Diagnosis not present

## 2022-12-31 DIAGNOSIS — I10 Essential (primary) hypertension: Secondary | ICD-10-CM | POA: Diagnosis not present

## 2022-12-31 DIAGNOSIS — G4700 Insomnia, unspecified: Secondary | ICD-10-CM | POA: Diagnosis not present

## 2023-04-23 ENCOUNTER — Other Ambulatory Visit: Payer: Self-pay | Admitting: Physician Assistant

## 2023-06-29 ENCOUNTER — Other Ambulatory Visit: Payer: Self-pay | Admitting: Physician Assistant

## 2023-07-08 DIAGNOSIS — Z9181 History of falling: Secondary | ICD-10-CM | POA: Diagnosis not present

## 2023-07-08 DIAGNOSIS — M5416 Radiculopathy, lumbar region: Secondary | ICD-10-CM | POA: Diagnosis not present

## 2023-07-08 DIAGNOSIS — G4700 Insomnia, unspecified: Secondary | ICD-10-CM | POA: Diagnosis not present

## 2023-07-08 DIAGNOSIS — Z139 Encounter for screening, unspecified: Secondary | ICD-10-CM | POA: Diagnosis not present

## 2023-07-08 DIAGNOSIS — I2510 Atherosclerotic heart disease of native coronary artery without angina pectoris: Secondary | ICD-10-CM | POA: Diagnosis not present

## 2023-07-08 DIAGNOSIS — G8929 Other chronic pain: Secondary | ICD-10-CM | POA: Diagnosis not present

## 2023-07-08 DIAGNOSIS — I10 Essential (primary) hypertension: Secondary | ICD-10-CM | POA: Diagnosis not present

## 2023-07-08 DIAGNOSIS — E7800 Pure hypercholesterolemia, unspecified: Secondary | ICD-10-CM | POA: Diagnosis not present

## 2023-07-08 DIAGNOSIS — Z1331 Encounter for screening for depression: Secondary | ICD-10-CM | POA: Diagnosis not present

## 2023-09-24 ENCOUNTER — Other Ambulatory Visit: Payer: Self-pay | Admitting: Physician Assistant

## 2024-01-01 ENCOUNTER — Other Ambulatory Visit: Payer: Self-pay | Admitting: Internal Medicine

## 2024-01-07 DIAGNOSIS — I10 Essential (primary) hypertension: Secondary | ICD-10-CM | POA: Diagnosis not present

## 2024-01-07 DIAGNOSIS — M5416 Radiculopathy, lumbar region: Secondary | ICD-10-CM | POA: Diagnosis not present

## 2024-01-07 DIAGNOSIS — I2510 Atherosclerotic heart disease of native coronary artery without angina pectoris: Secondary | ICD-10-CM | POA: Diagnosis not present

## 2024-01-07 DIAGNOSIS — G4700 Insomnia, unspecified: Secondary | ICD-10-CM | POA: Diagnosis not present

## 2024-01-07 DIAGNOSIS — Z125 Encounter for screening for malignant neoplasm of prostate: Secondary | ICD-10-CM | POA: Diagnosis not present

## 2024-01-07 DIAGNOSIS — E7800 Pure hypercholesterolemia, unspecified: Secondary | ICD-10-CM | POA: Diagnosis not present

## 2024-01-07 DIAGNOSIS — G8929 Other chronic pain: Secondary | ICD-10-CM | POA: Diagnosis not present

## 2024-06-10 ENCOUNTER — Other Ambulatory Visit: Payer: Self-pay | Admitting: Internal Medicine
